# Patient Record
Sex: Female | Born: 1960 | Race: Black or African American | Hispanic: No | Marital: Single | Smoking: Never smoker
Health system: Southern US, Community
[De-identification: ages and names within clinical notes are randomized; demographics above are authoritative.]

## PROBLEM LIST (undated history)

## (undated) DIAGNOSIS — N200 Calculus of kidney: Secondary | ICD-10-CM

## (undated) DIAGNOSIS — I10 Essential (primary) hypertension: Secondary | ICD-10-CM

## (undated) DIAGNOSIS — E079 Disorder of thyroid, unspecified: Secondary | ICD-10-CM

## (undated) DIAGNOSIS — K219 Gastro-esophageal reflux disease without esophagitis: Secondary | ICD-10-CM

## (undated) DIAGNOSIS — T7840XA Allergy, unspecified, initial encounter: Secondary | ICD-10-CM

## (undated) DIAGNOSIS — E119 Type 2 diabetes mellitus without complications: Secondary | ICD-10-CM

## (undated) DIAGNOSIS — E785 Hyperlipidemia, unspecified: Secondary | ICD-10-CM

## (undated) HISTORY — DX: Hyperlipidemia, unspecified: E78.5

## (undated) HISTORY — DX: Allergy, unspecified, initial encounter: T78.40XA

## (undated) HISTORY — PX: COLONOSCOPY: SHX174

## (undated) HISTORY — DX: Disorder of thyroid, unspecified: E07.9

## (undated) HISTORY — DX: Calculus of kidney: N20.0

## (undated) HISTORY — PX: MYOMECTOMY: SHX85

## (undated) HISTORY — PX: OTHER SURGICAL HISTORY: SHX169

## (undated) HISTORY — DX: Gastro-esophageal reflux disease without esophagitis: K21.9

---

## 2014-04-20 ENCOUNTER — Encounter (HOSPITAL_COMMUNITY): Payer: Self-pay | Admitting: Emergency Medicine

## 2014-04-20 ENCOUNTER — Emergency Department (HOSPITAL_COMMUNITY): Payer: Self-pay

## 2014-04-20 ENCOUNTER — Emergency Department (HOSPITAL_COMMUNITY)
Admission: EM | Admit: 2014-04-20 | Discharge: 2014-04-20 | Disposition: A | Discharge status: Home / Self Care | Payer: Self-pay | Attending: Emergency Medicine | Admitting: Emergency Medicine

## 2014-04-20 DIAGNOSIS — T1490XA Injury, unspecified, initial encounter: Secondary | ICD-10-CM

## 2014-04-20 DIAGNOSIS — I10 Essential (primary) hypertension: Secondary | ICD-10-CM | POA: Insufficient documentation

## 2014-04-20 DIAGNOSIS — Y998 Other external cause status: Secondary | ICD-10-CM | POA: Insufficient documentation

## 2014-04-20 DIAGNOSIS — W1839XA Other fall on same level, initial encounter: Secondary | ICD-10-CM | POA: Insufficient documentation

## 2014-04-20 DIAGNOSIS — Y9389 Activity, other specified: Secondary | ICD-10-CM | POA: Insufficient documentation

## 2014-04-20 DIAGNOSIS — Y9289 Other specified places as the place of occurrence of the external cause: Secondary | ICD-10-CM | POA: Insufficient documentation

## 2014-04-20 DIAGNOSIS — E119 Type 2 diabetes mellitus without complications: Secondary | ICD-10-CM | POA: Insufficient documentation

## 2014-04-20 DIAGNOSIS — S93401A Sprain of unspecified ligament of right ankle, initial encounter: Secondary | ICD-10-CM | POA: Insufficient documentation

## 2014-04-20 HISTORY — DX: Essential (primary) hypertension: I10

## 2014-04-20 HISTORY — DX: Type 2 diabetes mellitus without complications: E11.9

## 2014-04-20 MED ORDER — HYDROCODONE-ACETAMINOPHEN 5-325 MG PO TABS
1.0000 | ORAL_TABLET | ORAL | Status: DC | PRN
Start: 2014-04-20 — End: 2015-12-23

## 2014-04-20 NOTE — ED Notes (Signed)
Pt. returned to room, placed on monitor.

## 2014-04-20 NOTE — ED Notes (Signed)
Pt came to ED from home states she fell at home last Saturday and has small laceration on her right knee, pt states she now is having pain 8/10 pain on her right leg and is unable to wear weight on that leg.

## 2014-04-20 NOTE — Discharge Instructions (Signed)
Ankle Sprain An ankle sprain is an injury to the strong, fibrous tissues (ligaments) that hold the bones of your ankle joint together.  CAUSES An ankle sprain is usually caused by a fall or by twisting your ankle. Ankle sprains most commonly occur when you step on the outer edge of your foot, and your ankle turns inward. People who participate in sports are more prone to these types of injuries.  SYMPTOMS   Pain in your ankle. The pain may be present at rest or only when you are trying to stand or walk.  Swelling.  Bruising. Bruising may develop immediately or within 1 to 2 days after your injury.  Difficulty standing or walking, particularly when turning corners or changing directions. DIAGNOSIS  Your caregiver will ask you details about your injury and perform a physical exam of your ankle to determine if you have an ankle sprain. During the physical exam, your caregiver will press on and apply pressure to specific areas of your foot and ankle. Your caregiver will try to move your ankle in certain ways. An X-ray exam may be done to be sure a bone was not broken or a ligament did not separate from one of the bones in your ankle (avulsion fracture).  TREATMENT  Certain types of braces can help stabilize your ankle. Your caregiver can make a recommendation for this. Your caregiver may recommend the use of medicine for pain. If your sprain is severe, your caregiver may refer you to a surgeon who helps to restore function to parts of your skeletal system (orthopedist) or a physical therapist. [place] ice to your injury for 1-2 days or as directed by your caregiver. Applying ice helps to reduce inflammation and pain.  Put ice in a plastic bag.  Place a towel between your skin and the bag.  Leave the ice on for 15-20 minutes at a time, every 2 hours while you are awake.  Only take over-the-counter or prescription medicines for pain, discomfort, or fever as directed by  your caregiver.  Elevate your injured ankle above the level of your heart as much as possible for 2-3 days.  If your caregiver recommends crutches, use them as instructed. Gradually put weight on the affected ankle. Continue to use crutches or a cane until you can walk without feeling pain in your ankle.  If you have a plaster splint, wear the splint as directed by your caregiver. Do not rest it on anything harder than a pillow for the first 24 hours. Do not put weight on it. Do not get it wet. You may take it off to take a shower or bath.  You may have been given an elastic bandage to wear around your ankle to provide support. If the elastic bandage is too tight (you have numbness or tingling in your foot or your foot becomes cold and blue), adjust the bandage to make it comfortable.  If you have an air splint, you may blow more air into it or let air out to make it more comfortable. You may take your splint off at night and before taking a shower or bath. Wiggle your toes in the splint several times per day to decrease swelling. SEEK MEDICAL CARE IF:   You have rapidly increasing bruising or swelling.  Your toes feel extremely cold or you lose feeling in your foot.  Your pain is not relieved with medicine. SEEK IMMEDIATE MEDICAL CARE IF: 1. Your toes are numb or blue.  2. You have severe pain that is increasing. MAKE SURE YOU:  1. Understand these instructions. 2. Will watch your condition. 3. Will get help right away if you are not doing well or get worse. Document Released: 02/27/2005 Document Revised: 11/22/2011 Document Reviewed: 03/11/2011 [facility] Patient Information 2015 [place], [place]. This information is not intended to replace advice given to you by your health care provider. Make sure you discuss any questions you have with your health care provider. Cast or Splint Care Casts and splints support injured limbs and keep bones from moving while they heal. It is important to care  for your cast or splint at home.  HOME CARE INSTRUCTIONS  Keep the cast or splint uncovered during the drying period. It can take 24 to 48 hours to dry if it is made of plaster. A fiberglass cast will dry in less than 1 hour.  Do not rest the cast on anything harder than a pillow for the first 24 hours.  Do not put weight on your injured limb or apply pressure to the cast until your health care provider gives you permission.  Keep the cast or splint dry. Wet casts or splints can lose their shape and may not support the limb as well. A wet cast that has lost its shape can also create harmful pressure on your skin when it dries. Also, wet skin can become infected.  Cover the cast or splint with a plastic bag when bathing or when out in the rain or snow. If the cast is on the trunk of the body, take sponge baths until the cast is removed.  If your cast does become wet, dry it with a towel or a blow dryer on the cool setting only.  Keep your cast or splint clean. Soiled casts may be wiped with a moistened cloth.  Do not place any hard or soft foreign objects under your cast or splint, such as cotton, toilet paper, lotion, or powder.  Do not try to scratch the skin under the cast with any object. The object could get stuck inside the cast. Also, scratching could lead to an infection. If itching is a problem, use a blow dryer on a cool setting to relieve discomfort.  Do not trim or cut your cast or remove padding from inside of it.  Exercise all joints next to the injury that are not immobilized by the cast or splint. For example, if you have a long leg cast, exercise the hip joint and toes. If you have an arm cast or splint, exercise the shoulder, elbow, thumb, and fingers.  Elevate your injured arm or leg on 1 or 2 pillows for the first 1 to 3 days to decrease swelling and pain.It is best if you can comfortably elevate your cast so it is higher than your heart. SEEK MEDICAL CARE IF:   Your  cast or splint cracks.  Your cast or splint is too tight or too loose.  You have unbearable itching inside the cast.  Your cast becomes wet or develops a soft spot or area.  You have a bad smell coming from inside your cast.  You get an object stuck under your cast.  Your skin around the cast becomes red or raw.  You have new pain or worsening pain after the cast has been applied. SEEK IMMEDIATE MEDICAL CARE IF:   You have fluid leaking through the cast.  You are unable to move your fingers or toes.  You have discolored (blue  or white), cool, painful, or very swollen fingers or toes beyond the cast.  You have tingling or numbness around the injured area.  You have severe pain or pressure under the cast.  You have any difficulty with your breathing or have shortness of breath.  You have chest pain. Document Released: 02/25/2000 Document Revised: 12/18/2012 Document Reviewed: 09/05/2012 [facility] Patient Information 2015 [place], [place]. This information is not intended to replace advice given to you by your health care provider. Make sure you discuss any questions you have with your health care provider. Crutch Use Crutches are used to take weight off one of your legs or feet when you stand or walk. It is important to use crutches that fit properly. When fitted properly:  Each crutch should be 2-3 finger widths below the armpit.  Your weight should be supported by your hand, and not by resting the armpit on the crutch.  RISKS AND COMPLICATIONS Damage to the nerves that extend from your armpit to your hand and arm. To prevent this from happening, make sure your crutches fit properly and do not put pressure on your armpit when using them. HOW TO USE YOUR CRUTCHES If you have been instructed to use partial weight bearing, apply (bear) the amount of weight as your health care provider suggests. Do not bear weight in an amount that causes pain to the area of  injury. Walking  Step with the crutches.  Swing the healthy leg slightly ahead of the crutches. Going Up Steps If there is no handrail:  Step up with the healthy leg.  Step up with the crutches and injured leg.  Continue in this way. If there is a handrail: 3. Hold both crutches in one hand. 4. Place your free hand on the handrail. 5. While putting your weight on your arms, lift your healthy leg to the step. 6. Bring the crutches and the injured leg up to that step. 7. Continue in this way. Going Down Steps Be very careful, as going down stairs with crutches is very challenging. If there is no handrail: 4. Step down with the injured leg and crutches. 5. Step down with the healthy leg. If there is a handrail: 1. Place your hand on the handrail. 2. Hold both crutches with your free hand. 3. Lower your injured leg and crutch to the step below you. Make sure to keep the crutch tips in the center of the step, never on the edge. 4. Lower your healthy leg to that step. 5. Continue in this way. Standing Up 1. Hold the injured leg forward. 2. Grab the armrest with one hand and the top of the crutches with the other hand. 3. Using these supports, pull yourself up to a standing position. Sitting Down 1. Hold the injured leg forward. 2. Grab the armrest with one hand and the top of the crutches with the other hand. 3. Lower yourself to a sitting position. SEEK MEDICAL CARE IF:  You still feel unsteady on your feet.  You develop new pain, for example in your armpits, back, shoulder, wrist, or hip.  You develop any numbness or tingling. SEEK IMMEDIATE MEDICAL CARE IF: You fall. Document Released: 02/25/2000 Document Revised: 03/04/2013 Document Reviewed: 11/04/2012 [facility] Patient Information 2015 [place], [place]. This information is not intended to replace advice given to you by your health care provider. Make sure you discuss any questions you have with your health care  provider.

## 2014-04-20 NOTE — ED Notes (Signed)
Dr. Allen at the bedside.  

## 2014-04-20 NOTE — ED Notes (Signed)
Patient transported to X-ray 

## 2014-04-20 NOTE — ED Provider Notes (Signed)
CSN: [id number]     Arrival date & time 04/20/14  0650 History   First MD Initiated Contact with Patient 04/20/14 [phone]     Chief Complaint  Patient presents with  . Leg Pain     (Consider location/radiation/quality/duration/timing/severity/associated sxs/prior Treatment) HPI Comments: Patient here after mechanical fall 2 days ago while going down stairs. Complains of pain to her right ankle right foot. Pain characterized as dull and worse with movement or ambulation. Denies any hip or knee pain. Pain better with rest. No medications used prior to arrival. Denies any other symptoms  Patient is a 54 y.o. female presenting with leg pain. The history is provided by the patient.  Leg Pain   Past Medical History  Diagnosis Date  . Diabetes mellitus without complication   . Hypertension    History reviewed. No pertinent past surgical history. History reviewed. No pertinent family history. History  Substance Use Topics  . Smoking status: Never Smoker   . Smokeless tobacco: Never Used  . Alcohol Use: Yes     Comment: socially   OB History    No data available     Review of Systems  All other systems reviewed and are negative.     Allergies  Review of patient's allergies indicates not on file.  Home Medications   Prior to Admission medications   Not on File   BP 183/95 mmHg  Pulse 88  Temp(Src) 98.4 F (36.9 C) (Oral)  Resp 18  Ht 5\' 7"  (1.702 m)  Wt 215 lb (97.523 kg)  BMI 33.67 kg/m2  SpO2 99%  LMP 04/20/2008 Physical Exam  Constitutional: She is oriented to person, place, and time. She appears well-developed and well-nourished.  Non-toxic appearance. No distress.  HENT:  Head: Normocephalic and atraumatic.  Eyes: Conjunctivae, EOM and lids are normal. Pupils are equal, round, and reactive to light.  Neck: Normal range of motion. Neck supple. No tracheal deviation present. No thyroid mass present.  Cardiovascular: Normal rate, regular rhythm and normal heart  sounds.  Exam reveals no gallop.   No murmur heard. Pulmonary/Chest: Effort normal and breath sounds normal. No stridor. No respiratory distress. She has no decreased breath sounds. She has no wheezes. She has no rhonchi. She has no rales.  Abdominal: Soft. Normal appearance and bowel sounds are normal. She exhibits no distension. There is no tenderness. There is no rebound and no CVA tenderness.  Musculoskeletal: Normal range of motion. She exhibits no edema or tenderness.       Right ankle: She exhibits swelling. She exhibits normal range of motion, no deformity and no laceration.       Feet:  Neurological: She is alert and oriented to person, place, and time. She has normal strength. No cranial nerve deficit or sensory deficit. GCS eye subscore is 4. GCS verbal subscore is 5. GCS motor subscore is 6.  Skin: Skin is warm and dry. No abrasion and no rash noted.  Psychiatric: She has a normal mood and affect. Her speech is normal and behavior is normal.  Nursing note and vitals reviewed.   ED Course  Procedures (including critical care time) Labs Review Labs Reviewed - No data to display  Imaging Review No results found.   EKG Interpretation None      MDM  Crutches and ASO given by nursing. Referral to orthopedics given as well 2.    Leota Jacobsen, MD 04/20/14 [phone]

## 2015-01-28 ENCOUNTER — Encounter (HOSPITAL_COMMUNITY): Payer: Self-pay | Admitting: Emergency Medicine

## 2015-01-28 ENCOUNTER — Emergency Department (HOSPITAL_COMMUNITY): Payer: No Typology Code available for payment source

## 2015-01-28 ENCOUNTER — Emergency Department (HOSPITAL_COMMUNITY)
Admission: EM | Admit: 2015-01-28 | Discharge: 2015-01-28 | Disposition: A | Discharge status: Home / Self Care | Payer: No Typology Code available for payment source | Attending: Emergency Medicine | Admitting: Emergency Medicine

## 2015-01-28 DIAGNOSIS — I10 Essential (primary) hypertension: Secondary | ICD-10-CM | POA: Insufficient documentation

## 2015-01-28 DIAGNOSIS — Z7982 Long term (current) use of aspirin: Secondary | ICD-10-CM | POA: Insufficient documentation

## 2015-01-28 DIAGNOSIS — Y9389 Activity, other specified: Secondary | ICD-10-CM | POA: Insufficient documentation

## 2015-01-28 DIAGNOSIS — S99921A Unspecified injury of right foot, initial encounter: Secondary | ICD-10-CM | POA: Diagnosis present

## 2015-01-28 DIAGNOSIS — Y998 Other external cause status: Secondary | ICD-10-CM | POA: Diagnosis not present

## 2015-01-28 DIAGNOSIS — Y92410 Unspecified street and highway as the place of occurrence of the external cause: Secondary | ICD-10-CM | POA: Insufficient documentation

## 2015-01-28 DIAGNOSIS — E119 Type 2 diabetes mellitus without complications: Secondary | ICD-10-CM | POA: Insufficient documentation

## 2015-01-28 DIAGNOSIS — S92414A Nondisplaced fracture of proximal phalanx of right great toe, initial encounter for closed fracture: Secondary | ICD-10-CM | POA: Insufficient documentation

## 2015-01-28 DIAGNOSIS — Z79899 Other long term (current) drug therapy: Secondary | ICD-10-CM | POA: Diagnosis not present

## 2015-01-28 DIAGNOSIS — S92911A Unspecified fracture of right toe(s), initial encounter for closed fracture: Secondary | ICD-10-CM
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
## 2015-01-28 NOTE — Discharge Instructions (Signed)
Toe Fracture A toe fracture is a break in one of the toe bones (phalanges). CAUSES This condition may be caused by:  Dropping a heavy object on your toe.  Stubbing your toe.  Overusing your toe or doing repetitive exercise.  Twisting or stretching your toe out of place. RISK FACTORS This condition is more likely to develop in people who:  Play contact sports.  Have a bone disease.  Have a low calcium level. SYMPTOMS The main symptoms of this condition are swelling and pain in the toe. The pain may get worse with standing or walking. Other symptoms include:  Bruising.  Stiffness.  Numbness.  A change in the way the toe looks.  Broken bones that poke through the skin.  Blood beneath the toenail. DIAGNOSIS This condition is diagnosed with a physical exam. You may also have X-rays. TREATMENT  Treatment for this condition depends on the type of fracture and its severity. Treatment may involve:  Taping the broken toe to a toe that is next to it (buddy taping). This is the most common treatment for fractures in which the bone has not moved out of place (nondisplaced fracture).  Wearing a shoe that has a wide, rigid sole to protect the toe and to limit its movement.  Wearing a walking cast.  Having a procedure to move the toe back into place.  Surgery. This may be needed:  If there are many pieces of broken bone that are out of place (displaced).  If the toe joint breaks.  If the bone breaks through the skin.  Physical therapy. This is done to help regain movement and strength in the toe. You may need follow-up X-rays to make sure that the bone is healing well and staying in position. HOME CARE INSTRUCTIONS If You Have a Cast:  Do not stick anything inside the cast to scratch your skin. Doing that increases your risk of infection.  Check the skin around the cast every day. Report any concerns to your health care provider. You may put lotion on dry skin around the  edges of the cast. Do not apply lotion to the skin underneath the cast.  Do not put pressure on any part of the cast until it is fully hardened. This may take several hours.  Keep the cast clean and dry. Bathing  Do not take baths, swim, or use a hot tub until your health care provider approves. Ask your health care provider if you can take showers. You may only be allowed to take sponge baths for bathing.  If your health care provider approves bathing and showering, cover the cast or bandage (dressing) with a watertight plastic bag to protect it from water. Do not let the cast or dressing get wet. Managing Pain, Stiffness, and Swelling  If you do not have a cast, apply ice to the injured area, if directed.  Put ice in a plastic bag.  Place a towel between your skin and the bag.  Leave the ice on for 20 minutes, 2-3 times per day.  Move your toes often to avoid stiffness and to lessen swelling.  Raise (elevate) the injured area above the level of your heart while you are sitting or lying down. Driving  Do not drive or operate heavy machinery while taking pain medicine.  Do not drive while wearing a cast on a foot that you use for driving. Activity  Return to your normal activities as directed by your health care provider. Ask your health care  provider what activities are safe for you.  Perform exercises daily as directed by your health care provider or physical therapist. Safety  Do not use the injured limb to support your body weight until your health care provider says that you can. Use crutches or other assistive devices as directed by your health care provider. General Instructions  If your toe was treated with buddy taping, follow your health care provider's instructions for changing the gauze and tape. Change it more often:  The gauze and tape get wet. If this happens, dry the space between the toes.  The gauze and tape are too tight and cause your toe to become pale  or numb.  Wear a protective shoe as directed by your health care provider. If you were not given a protective shoe, wear sturdy, supportive shoes. Your shoes should not pinch your toes and should not fit tightly against your toes.  Do not use any tobacco products, including cigarettes, chewing tobacco, or e-cigarettes. Tobacco can delay bone healing. If you need help quitting, ask your health care provider.  Take medicines only as directed by your health care provider.  Keep all follow-up visits as directed by your health care provider. This is important. SEEK MEDICAL CARE IF:  You have a fever.  Your pain medicine is not helping.  Your toe is cold.  Your toe is numb.  You still have pain after one week of rest and treatment.  You still have pain after your health care provider has said that you can start walking again.  You have pain, tingling, or numbness in your foot that is not going away. SEEK IMMEDIATE MEDICAL CARE IF:  You have severe pain.  You have redness or inflammation in your toe that is getting worse.  You have pain or numbness in your toe that is getting worse.  Your toe turns blue.   This information is not intended to replace advice given to you by your health care provider. Make sure you discuss any questions you have with your health care provider.   Document Released: 02/25/2000 Document Revised: 11/18/2014 Document Reviewed: 12/24/2013 Elsevier Interactive Patient Education 2016 [employer]  Toe Fracture With Rehab A fracture is a break in the bone that can be either partial or complete. Fractures of the toe bones may or may not include the joints that separate the bones. SYMPTOMS   Severe pain over the fracture site at the time of injury that may persist for an extend period of time.  Pain, tenderness, inflammation, and/or bruising (contusion) over the fracture site.  Visible deformity, if the bone fragments are not properly aligned (displaced  fracture).  Signs of vascular damage: numbness or coldness (uncommon). CAUSES  Toe fractures occur when a force is placed on the bone that is greater than it can withstand.  Direct hit (trauma) to the toe.  Indirect trauma to the toe, such as forcefully pivoting on a planted foot. RISK INCREASES WITH:  Performing activities barefoot (i.e. ballet, gymnastics).  Wearing shoes with little support or protection.  Sports with cleats (i.e. football, rugby, lacrosse, soccer).  Bone disease (i.e. osteoporosis, bone tumors). PREVENTION   Wear properly fitted and protective shoes.  Protect previously injured toes with tape or padding. PROGNOSIS  If treated properly, toe fractures usually heal within 4 to 6 weeks. RELATED COMPLICATIONS   Failure of the fracture to heal (nonunion).  Healing of the fracture in a poor position (malunion).  Recurring symptoms.  Recurring symptoms that result  in a chronic problem.  Excessive bleeding, causing pressure on nerves and blood vessels (rare).  Arthritis of the affected joints.  Stopping of bone growth in children.  Infection in fractures where the skin is broken over the fracture (open fracture).  Shortening of injured bones. TREATMENT  Treatment first involves the use of ice and medicine to reduce pain and inflammation. The toe should be restrained for a period of time to allow for healing, usually about 4 weeks. Your caregiver may advise wearing a hard-soled shoe to minimize stress on the healing bone. Surgery is uncommon for this injury, but may be necessary if the fracture is severely displaced or if the bone pushes through the skin. Surgery typically involves the use of screws, pins, and/or plates to hold the fracture in place. After surgery, restraint of the foot is necessary. MEDICATION   If pain medicine is necessary, nonsteroidal anti-inflammatory medications (aspirin and ibuprofen), or other minor pain relievers (acetaminophen),  are often recommended.  Do not take pain medicine for 7 days before surgery.  Prescription pain relievers may be given if your caregiver thinks they are needed. Use only as directed and only as much as you need. COLD THERAPY  Cold treatment (icing) relieves pain and reduces inflammation. Cold treatment should be applied for 10 to 15 minutes every 2 to 3 hours, and immediately after activity that aggravates your symptoms. Use ice packs or an ice massage. SEEK MEDICAL CARE IF:   Treatment does not seem to help, or the condition gets worse.  Any medicines produce negative side effects.  Any complications from surgery occur:  Pain, numbness, or coldness in the affected foot.  Discoloration beneath the toenails (blue or gray) of the affected foot.  Signs of infection (fever, pain, inflammation, redness, or persistent bleeding). EXERCISES RANGE OF MOTION (ROM) AND STRETCHING EXERCISES - Toe Fracture (Phalangeal) These exercises may help you when beginning to rehabilitate your injury. Your symptoms may resolve with or without further involvement from your physician, physical therapist or athletic trainer. While completing these exercises, remember:   Restoring tissue flexibility helps normal motion to return to the joints. This allows healthier, less painful movement and activity.  An effective stretch should be held for at least 30 seconds.  A stretch should never be painful. You should only feel a gentle lengthening or release in the stretched tissue. RANGE OF MOTION - Dorsi/Plantar Flexion  While sitting with your right / left knee straight, draw the top of your foot upwards by flexing your ankle. Then reverse the motion, pointing your toes downward.  Hold each position for __________ seconds.  After completing your first set of exercises, repeat this exercise with your knee bent. Repeat __________ times. Complete this exercise __________ times per day.  RANGE OF MOTION - Ankle  Alphabet Imagine your right / left big toe is a pen. Keeping your hip and knee still, write out the entire alphabet with your "pen." Make the letters as large as you can without increasing any discomfort. Repeat __________ times. Complete this exercise __________ times per day.  RANGE OF MOTION - Toe Extension, Flexion  Sit with your right / left leg crossed over your opposite knee.  Grasp your toes and gently pull them back toward the top of your foot. You should feel a stretch on the bottom of your toes and foot.  Hold this stretch for __________ seconds.  Now, gently pull your toes toward the bottom of your foot. You should feel a stretch on  the top of your toes and foot.  Hold this stretch for __________ seconds. Repeat __________ times. Complete this stretch__________ times per day.  STRENGTHENING EXERCISES - Toe Fracture (Phalangeal) These exercises may help you when beginning to rehabilitate your injury. They may resolve your symptoms with or without further involvement from your physician, physical therapist or athletic trainer. While completing these exercises, remember:   Muscles can gain both the endurance and the strength needed for everyday activities through controlled exercises.  Complete these exercises as instructed by your physician, physical therapist or athletic trainer. Increase the resistance and repetitions only as guided.  You may experience muscle soreness or fatigue, but the pain or discomfort you are trying to eliminate should never worsen during these exercises. If this pain does get worse, stop and make sure you are following the directions exactly. If the pain is still present after adjustments, discontinue the exercise until you can discuss the trouble with your clinician. STRENGTH - Towel Curls  Sit in a chair, on a non-carpeted surface.  Place your foot on a towel, keeping your heel on the floor.  Pull the towel toward your heel only by curling your  toes. Keep your heel on the floor.  If instructed by your physician, physical therapist or athletic trainer, add ____________________ at the end of the towel. Repeat __________ times. Complete this exercise __________ times per day.   This information is not intended to replace advice given to you by your health care provider. Make sure you discuss any questions you have with your health care provider.    Follow up with orthopedist if symptoms do not improve over the next 2 weeks. Apply ice to the affected area. Keep elevated. Buddy tape toes at home for increased stability. Return to the emergency department if you experience signs of infection or discoloration of the nail bed.

## 2015-01-28 NOTE — ED Notes (Signed)
Patient transported to X-ray 
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
## 2015-02-11 ENCOUNTER — Encounter (HOSPITAL_COMMUNITY): Payer: Self-pay | Admitting: *Deleted

## 2015-02-11 ENCOUNTER — Emergency Department (HOSPITAL_COMMUNITY)
Admission: EM | Admit: 2015-02-11 | Discharge: 2015-02-11 | Disposition: A | Discharge status: Home / Self Care | Payer: No Typology Code available for payment source | Attending: Emergency Medicine | Admitting: Emergency Medicine

## 2015-02-11 ENCOUNTER — Emergency Department (HOSPITAL_COMMUNITY): Payer: No Typology Code available for payment source

## 2015-02-11 DIAGNOSIS — E119 Type 2 diabetes mellitus without complications: Secondary | ICD-10-CM | POA: Diagnosis not present

## 2015-02-11 DIAGNOSIS — Z7982 Long term (current) use of aspirin: Secondary | ICD-10-CM | POA: Diagnosis not present

## 2015-02-11 DIAGNOSIS — I10 Essential (primary) hypertension: Secondary | ICD-10-CM | POA: Diagnosis not present

## 2015-02-11 DIAGNOSIS — M79671 Pain in right foot: Secondary | ICD-10-CM | POA: Diagnosis present

## 2015-02-11 DIAGNOSIS — Z8781 Personal history of (healed) traumatic fracture: Secondary | ICD-10-CM | POA: Diagnosis not present

## 2015-02-11 DIAGNOSIS — Z79899 Other long term (current) drug therapy: Secondary | ICD-10-CM | POA: Insufficient documentation

## 2015-02-11 MED ORDER — TRAMADOL HCL 50 MG PO TABS
50.0000 mg | ORAL_TABLET | Freq: Once | ORAL | Status: AC
  Administered 2015-02-11: 50 mg via ORAL
  Filled 2015-02-11: qty 1
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
## 2015-02-11 NOTE — ED Notes (Signed)
Declined W/C at D/C and was escorted to lobby by RN. 
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
## 2015-02-11 NOTE — Discharge Instructions (Signed)
°Emergency Department Resource Guide °1) Find a Doctor and Pay Out of Pocket °Although you won't have to find out who is covered by your insurance plan, it is a good idea to ask around and get recommendations. You will then need to call the office and see if the doctor you have chosen will accept you as a new patient and what types of options they offer for patients who are self-pay. Some doctors offer discounts or will set up payment plans for their patients who do not have insurance, but you will need to ask so you aren't surprised when you get to your appointment. ° °2) Contact Your Local Health Department °Not all health departments have doctors that can see patients for sick visits, but many do, so it is worth a call to see if yours does. If you don't know where your local health department is, you can check in your phone book. The CDC also has a tool to help you locate your state's health department, and many state websites also have listings of all of their local health departments. ° °3) Find a Walk-in Clinic °If your illness is not likely to be very severe or complicated, you may want to try a walk in clinic. These are popping up all over the country in pharmacies, drugstores, and shopping centers. They're usually staffed by nurse practitioners or physician assistants that have been trained to treat common illnesses and complaints. They're usually fairly quick and inexpensive. However, if you have serious medical issues or chronic medical problems, these are probably not your best option. ° °No Primary Care Doctor: °- Call [facility] at  832-8000 - they can help you locate a primary care doctor that  accepts your insurance, provides certain services, etc. °- Physician Referral Service- 1-800-533-3463 ° °Chronic Pain Problems: °Organization         Address  Phone   Notes  °[facility]  (336) 297-2271 Patients need to be referred by their primary care doctor.  ° °Medication  Assistance: °Organization         Address  Phone   Notes  °[place] [facility] [address] °[place], [place] [address] (336) 641-8030 --Must be a resident of [place] °-- Must have NO insurance coverage whatsoever (no Medicaid/ Medicare, etc.) °-- The pt. MUST have a primary care doctor that directs their care regularly and follows them in the community °  °[facility]  (866) 331-1348   °[employer]  (888) 892-1162   ° °Agencies that provide inexpensive medical care: °Organization         Address  Phone   Notes  °[facility]  (336) 832-8035   °[facility]    (336) 832-7272   °[facility] [address] °[place], [place] [address] (336) 832-4777   °[facility] [address], °[place] (336) 271-4999   °[facility]    (336) 373-0678   °[facility]    (336) 272-1050   °[facility] ° [address], [place] Phone:  (336) 832-4444, Fax:  (336) 832-4440 Hours of Operation:  9 am - 6 pm, M-F.  Also accepts Medicaid/Medicare and self-pay.  °[facility] ° [address], [place] Phone: (336) 832-3150, Fax: (336) [phone]. Hours of Operation:  8:30 am - 5:30 pm, M-F.  Also accepts Medicaid and self-pay.  °[facility] [place] [address], [place] Phone: (336) 878-6027   °[facility] [address], [place], [place] (336)723-1848, Ext. 123 Mondays & Thursdays: 7-9 AM.  First 15 patients are seen on a first come, first serve basis. °  ° °Medicaid-accepting [place] Providers: ° °Organization         Address  Phone   Notes  °[facility] [address], [place] (336) 641-2100 Also accepts self-pay patients.  °[facility] [address], [place] ° (336) 856-9996   °[facility] [address], [place]  (336) 288-8857   °[facility] [address] [place] [address], [place] (336) 299-7000   °Veita Bland [address], [place]  ° (336) 373-1557 Only accepts [place] Access Medicaid patients after they have their name applied to their card.  ° °Self-Pay (no insurance) in [place]: ° °Organization         Address  Phone   Notes  °Sickle Cell Patients, [facility] [address], [place] (336) 832-1970   °[facility] [address], [place] (336) 832-4400   °[facility] ° [address], [place] (336) 992-4800   °[facility]/Dr. Osei-Bonsu ° [address] [place] [address], [place] or [address], [place] (336) [phone] Phone number for both [place] and [place] locations is the same.  °[facility] [address], [place] (336) 299-0000   °[facility] [address] [place] [address], [place] or [address] (336) 852-7530 °(336) 878-2260   °[facility] [address], [place] (336) 350-1642, phone; (336) 294-5005, fax Sees patients 1st and 3rd Saturday of every month.  Must not qualify for public or private insurance (i.e. Medicaid, Medicare, [place] Health Choice, Veterans' Benefits) • Household income should be no more than 200% of the poverty level •The clinic cannot treat you if you are pregnant or think you are pregnant • Sexually transmitted diseases are not treated at the clinic.  ° ° °Dental Care: °Organization         Address  Phone  Notes  °[place] [facility] [address], [place] (336) 641-6152 Accepts children up to age 21 who are enrolled in Medicaid or [place] Health Choice; pregnant women with a Medicaid card; and children who have applied for Medicaid or [place] Health Choice, but were declined, whose parents can pay a reduced fee at time of service.  °[place]  [facility] [place]  [address], [place] (336) 641-7733 Accepts children up to age 21 who are enrolled in Medicaid or [place] Health Choice; pregnant women with a Medicaid card; and children who have applied for Medicaid or [place] Health Choice, but were declined, whose parents can pay a reduced fee at time of service.  °[facility] ° [address], [place] (336) 641-4533 Patients are seen by appointment only. Walk-ins are not accepted. [facility] will see patients 18 years of age and older. °Monday - Tuesday (8am-5pm) °Most Wednesdays (8:30-5pm) °$30 per visit, cash only  °[facility] ° [address], [place] (336) 641-4533 Patients are seen by appointment only. Walk-ins are not accepted. [facility] will see patients 18 years of age and older. °One   Wednesday Evening (Monthly: Volunteer Based).  $30 per visit, cash only  °[facility]  (919) 537-3737 for adults; Children under age 4, call Graduate Pediatric Dentistry at ([phone]) [phone]. Children aged 4-14, please call ([phone]) [phone] to request a pediatric application. ° Dental services are provided in all areas of dental care including fillings, crowns and bridges, complete and partial dentures, implants, gum treatment, root canals, and extractions. Preventive care is also provided. Treatment is provided to both adults and children. °Patients are selected via a lottery and there is often a waiting list. °  °[facility] [address], °[place] ° (336) 763-8833 [email or web address] °  °[facility] [address], [place], [place] (336)723-1848, Ext. 123 Second and Fourth Thursday of each month, opens at 6:30 AM; Clinic ends at 9 AM.  Patients are seen on a first-come first-served basis, and a limited number are seen during each clinic.  ° °[facility] ° [address], [place], [place] (336) 723-7904    Eligibility Requirements °You must have lived in [place], [place], or [place] counties for at least the last three months. °  You cannot be eligible for state or federal sponsored healthcare insurance, including Veterans Administration, Medicaid, or Medicare. °  You generally cannot be eligible for healthcare insurance through your employer.  °  How to apply: °Eligibility screenings are held every Tuesday and Wednesday afternoon from 1:00 pm until 4:00 pm. You do not need an appointment for the interview!  °[facility] [address], [place], [place] 336-631-2330   °[facility] [facility]  336-342-8273   °[facility]  336-703-3100   ° [facility]  336-570-6415   ° °Behavioral Health Resources in the Community: °Intensive Outpatient Programs °Organization         Address  Phone  Notes  °[facility] [address], [place], [place] 336-878-6098   °[facility] [address], [place], [place] 336-832-9800   °[facility] [address], [place], [place] ° 336-882-2125   °[facility] [address],  °[place], [place] 1-800-853-5163 or 336-641-4981   °Substance Abuse Resources °Organization         Address  Phone  Notes  °[facility]  336-882-2125   °[facility]  336-784-9470   °[facility]  336-285-9073   °[facility]  336-845-3988   °Residential & Outpatient Substance Abuse Program  1-800-659-3381   °Psychological Services °Organization         Address  Phone  Notes  °[facility]  336- 832-9600   °[facility]  336- 378-7881   °[facility] [address], [place] 1-800-853-5163 or 336-641-4981   ° °Mobile Crisis Teams °Organization         Address  Phone  Notes  °[facility], Mobile Crisis Care Unit  1-877-626-1772   °Assertive °[facility] ° [address]  [place], [place] 336-834-9664   °Sharon DeEsch [address] °[place] 336-554-5454   ° °Self-Help/Support Groups °Organization         Address  Phone             Notes  °[facility] [place] - variety of support groups  336- 373-1402 Call for more information  °Narcotics Anonymous (NA), [facility] [address], °[place]  2 meetings at this location  ° °  Residential Treatment Programs °Organization         Address  Phone  Notes  °[facility] [address],    °[place]  1-866-801-8205   °[facility] ° [address], [place], [place] 704-293-8524   °[facility] [address], [place] 336-845-3988 Admissions: 8am-3pm M-F  °[facility] [address]    °[place], [place] 336-841-1104   °[facility] [address], [place], [place] 336-379-7146   °[facility] [address]  °[place], [place] 336-285-9073   °[facility] Intensive Outpatient [address], [place], [place] 336-852-3033   °[facility] [address]  °[place], [place] 1-877-615-2722 or 336-784-9470   °[facility] [address] [place], [place] 336-227-7417 Accepts Medicaid  °[facility] [address]  °[place] 1-800-659-3381 Substance Abuse/Addiction Treatment  ° °[place] Behavioral Health Resources °Organization         Address  Phone  Notes  °[facility]  (888) 581-9988   °Julie Brannon, PhD [address] [place], [place]   (336) 349-5553 or (336) 951-0000   °[facility]   [address] °[place], [place] (336) 349-4454   °[facility] [address], [place], [place] (336) 342-8316 Insurance/Medicaid/sponsorship through [facility]  °[facility] [address]                                    [place], [place] (336) 342-8316 Therapy/tele-psych/case    °[facility] [address]  ° [place], [place] (336) 349-2233    °Dr. Arfeen  (336) 349-4544   °[facility] [place]  [employer] [place] [facility] 1) [address],  °2) [address], [place] °3)  [address], [place] (336) 349-3220 °(336) 342-7768 ° °(336) 342-8140   °[place] [facility] (336) 342-1394 or (336) 342-3537 (After Hours)    ° ° °

## 2015-12-22 ENCOUNTER — Telehealth: Payer: Self-pay | Admitting: General Practice

## 2015-12-22 NOTE — Telephone Encounter (Signed)
APT. REMINDER CALL, LMTCB °

## 2015-12-23 ENCOUNTER — Encounter: Payer: Self-pay | Admitting: Internal Medicine

## 2015-12-23 ENCOUNTER — Ambulatory Visit (INDEPENDENT_AMBULATORY_CARE_PROVIDER_SITE_OTHER): Payer: BC Managed Care – PPO | Admitting: Internal Medicine

## 2015-12-23 VITALS — BP 134/72 | HR 83 | Temp 98.1°F | Ht 67.0 in | Wt 225.3 lb

## 2015-12-23 DIAGNOSIS — E1169 Type 2 diabetes mellitus with other specified complication: Secondary | ICD-10-CM | POA: Diagnosis not present

## 2015-12-23 DIAGNOSIS — E784 Other hyperlipidemia: Secondary | ICD-10-CM | POA: Diagnosis not present

## 2015-12-23 DIAGNOSIS — I10 Essential (primary) hypertension: Secondary | ICD-10-CM

## 2015-12-23 DIAGNOSIS — Z79899 Other long term (current) drug therapy: Secondary | ICD-10-CM

## 2015-12-23 DIAGNOSIS — E118 Type 2 diabetes mellitus with unspecified complications: Secondary | ICD-10-CM

## 2015-12-23 DIAGNOSIS — E785 Hyperlipidemia, unspecified: Secondary | ICD-10-CM

## 2015-12-23 DIAGNOSIS — Z7984 Long term (current) use of oral hypoglycemic drugs: Secondary | ICD-10-CM | POA: Diagnosis not present

## 2015-12-23 DIAGNOSIS — Z23 Encounter for immunization: Secondary | ICD-10-CM | POA: Diagnosis not present

## 2015-12-23 DIAGNOSIS — Z0000 Encounter for general adult medical examination without abnormal findings: Secondary | ICD-10-CM

## 2015-12-23 LAB — GLUCOSE, CAPILLARY: Glucose-Capillary: 130 mg/dL — ABNORMAL HIGH (ref 65–99)

## 2015-12-23 LAB — POCT GLYCOSYLATED HEMOGLOBIN (HGB A1C): HEMOGLOBIN A1C: 6.8

## 2015-12-23 NOTE — Progress Notes (Signed)
CC: here for DM and HTN  HPI:  [name] is a 55 y.o. woman with a past medical history listed below here today for follow up of her HTN and DM.  She has recently moved from [place] and is establishing care with [facility].  She has no other acute complaints today other than she has been noticing that the ring finger on her right hand seems to be catching and "getting stuck" over the past couple weeks.  This is a new problem for her and something she has not experienced in the past.  Otherwise she reports doing well.  No fevers, chills, chest pain, orthopnea, DOE, dysuria.  She needs refills of all her medications.  For details of today's visit and the status of her chronic medical issues please refer to the assessment and plan.   Past Medical History:  Diagnosis Date  . Diabetes mellitus without complication ([place])   . Hyperlipidemia   . Hypertension   . Kidney stone    during her 20's    Review of Systems:   Please see pertinent ROS reviewed in HPI and problem based charting.   Physical Exam:  Vitals:   12/23/15 1540  BP: 134/72  Pulse: 83  Temp: 98.1 F (36.7 C)  TempSrc: Oral  SpO2: 100%  Weight: 225 lb 4.8 oz (102.2 kg)  Height: 5\' 7"  (1.702 m)   ROS  Physical Exam  Constitutional: She is oriented to person, place, and time and well-developed, well-nourished, and in no distress.  HENT:  Head: Normocephalic and atraumatic.  Cardiovascular: Normal rate, regular rhythm, normal heart sounds and intact distal pulses.   Pulmonary/Chest: Effort normal and breath sounds normal. She has no wheezes.  Musculoskeletal: She exhibits no edema or tenderness.  She has no obvious deformity of her right ring finger.  There is no obvious clicking with passive or active ROM.  Neurological: She is alert and oriented to person, place, and time.  Skin: Skin is warm and dry.  Psychiatric: Mood and affect normal.    Assessment & Plan:   See Encounters Tab for problem based  charting.  Patient discussed with Dr. Dareen Piano.  Essential hypertension BP Readings from Last 3 Encounters:  12/23/15 134/72  02/11/15 141/79  01/28/15 123/67   A: BP today is 134/72.  She is on benazepril-amlodipine 40mg -10mg  daily plus metoprolol tartrate 50mg  daily from her prior provider in [place].  P:  - continue with current medications - refills sent to pharmacy - CMET today revealed no abnormalities  Type 2 diabetes mellitus ([place]) Lab Results  Component Value Date   HGBA1C 6.8 12/23/2015    A: Her A1C today is 6.8.  She is on metformin 1000mg  daily, Januvia 100mg  daily, and Glimperide 4mg  daily.  She reports checking her blood sugars about 2 times per day.  She did not bring her meter with her today but reports no hypo or hyperglycemia symptoms.  P: - continue current medications - foot exam done today - She reports having an eye exam in Feb 2017.  She has signed a release of information today.  Hyperlipidemia associated with type 2 diabetes mellitus ([facility]) A:  Her lipid panel shows TC 172, TG 114, HDL 51, and LDL 98.  Calculated ASCVD 10-year risk is 13.9%.  With her risk greater than 7.5% and history of DM, it is reasonable for her to be on high intensity statin.  Currently she is only on simvastatin 10mg  daily.  P:  - liver enzymes  were normal this visit - will prescribe atorvastatin 40mg  daily   Healthcare maintenance A/P: - flu shot given today - Pap smear last done in November 2014.  Records request obtained today.  Patient reports no hx of abnormal paps - Last colonoscopy was 5 years ago with polyps removed.  She said she just received a letter from her old GI in [place] that she is due for repeat in November.  Records request obtained.  Will refer to GI when we receive these. - Mammo done last year - normal per patient. - HIV today non-reactive - HCV Ab negative

## 2015-12-23 NOTE — Patient Instructions (Signed)
Thank you for coming to see me today. It was a pleasure. Today we talked about:   Your diabetes is doing great!  Keep up the great work!  I have sent refills to your pharmacy.  We will check some blood work for you today and let you know if anything is abnormal.  Please follow-up with [place] in 1 month for a Pap smear.  If you have any questions or concerns, please do not hesitate to call the office at [phone].  Take Care,   Jule Ser, DO

## 2015-12-24 ENCOUNTER — Encounter: Payer: Self-pay | Admitting: Internal Medicine

## 2015-12-24 DIAGNOSIS — E1169 Type 2 diabetes mellitus with other specified complication: Secondary | ICD-10-CM | POA: Insufficient documentation

## 2015-12-24 DIAGNOSIS — Z0000 Encounter for general adult medical examination without abnormal findings: Secondary | ICD-10-CM | POA: Insufficient documentation

## 2015-12-24 DIAGNOSIS — E119 Type 2 diabetes mellitus without complications: Secondary | ICD-10-CM | POA: Insufficient documentation

## 2015-12-24 DIAGNOSIS — I10 Essential (primary) hypertension: Secondary | ICD-10-CM | POA: Insufficient documentation

## 2015-12-24 DIAGNOSIS — E785 Hyperlipidemia, unspecified: Secondary | ICD-10-CM

## 2015-12-24 LAB — BMP8+ANION GAP
Anion Gap: 17 mmol/L (ref 10.0–18.0)
BUN / CREAT RATIO: 20 (ref 9–23)
BUN: 16 mg/dL (ref 6–24)
CHLORIDE: 102 mmol/L (ref 96–106)
CO2: 23 mmol/L (ref 18–29)
Calcium: 9.3 mg/dL (ref 8.7–10.2)
Creatinine, Ser: 0.79 mg/dL (ref 0.57–1.00)
GFR calc Af Amer: 96 mL/min/{1.73_m2} (ref >59)
GFR calc non Af Amer: 83 mL/min/{1.73_m2} (ref >59)
Glucose: 119 mg/dL — ABNORMAL HIGH (ref 65–99)
Potassium: 4.3 mmol/L (ref 3.5–5.2)
SODIUM: 142 mmol/L (ref 134–144)

## 2015-12-24 LAB — HEPATITIS C ANTIBODY

## 2015-12-24 LAB — CBC
Hematocrit: 40.4 % (ref 34.0–46.6)
Hemoglobin: 13.4 g/dL (ref 11.1–15.9)
MCH: 26.2 pg — ABNORMAL LOW (ref 26.6–33.0)
MCHC: 33.2 g/dL (ref 31.5–35.7)
MCV: 79 fL (ref 79–97)
Platelets: 256 10*3/uL (ref 150–379)
RBC: 5.12 x10E6/uL (ref 3.77–5.28)
RDW: 16.2 % — ABNORMAL HIGH (ref 12.3–15.4)
WBC: 11.1 10*3/uL — AB (ref 3.4–10.8)

## 2015-12-24 LAB — HEPATIC FUNCTION PANEL
ALT: 18 IU/L (ref 0–32)
AST: 16 IU/L (ref 0–40)
Albumin: 4.4 g/dL (ref 3.5–5.5)
Alkaline Phosphatase: 64 IU/L (ref 39–117)
Bilirubin Total: <0.2 mg/dL (ref 0.0–1.2)
Bilirubin, Direct: 0.08 mg/dL (ref 0.00–0.40)
TOTAL PROTEIN: 7.5 g/dL (ref 6.0–8.5)

## 2015-12-24 LAB — LIPID PANEL
Chol/HDL Ratio: 3.4 ratio units (ref 0.0–4.4)
Cholesterol, Total: 172 mg/dL (ref 100–199)
HDL: 51 mg/dL (ref >39)
LDL CALC: 98 mg/dL (ref 0–99)
TRIGLYCERIDES: 114 mg/dL (ref 0–149)
VLDL Cholesterol Cal: 23 mg/dL (ref 5–40)

## 2015-12-24 LAB — HIV ANTIBODY (ROUTINE TESTING W REFLEX): HIV Screen 4th Generation wRfx: NONREACTIVE

## 2015-12-24 MED ORDER — METOPROLOL TARTRATE 50 MG PO TABS: 50.0000 mg | ORAL_TABLET | Freq: Every day | ORAL | 1 refills | Status: DC

## 2015-12-24 MED ORDER — AMLODIPINE BESY-BENAZEPRIL HCL 10-40 MG PO CAPS: 1.0000 | ORAL_CAPSULE | Freq: Every day | ORAL | 1 refills | Status: DC

## 2015-12-24 MED ORDER — ATORVASTATIN CALCIUM 40 MG PO TABS: 40.0000 mg | ORAL_TABLET | Freq: Every day | ORAL | 1 refills | Status: DC

## 2015-12-24 MED ORDER — ASPIRIN EC 81 MG PO TBEC: 81.0000 mg | DELAYED_RELEASE_TABLET | Freq: Every day | ORAL | 1 refills | Status: DC

## 2015-12-24 MED ORDER — METFORMIN HCL 1000 MG PO TABS: 1000.0000 mg | ORAL_TABLET | Freq: Every day | ORAL | 1 refills | Status: DC

## 2015-12-24 MED ORDER — CENTRUM PO CHEW: 1.0000 | CHEWABLE_TABLET | Freq: Every day | ORAL | 1 refills | Status: AC

## 2015-12-24 MED ORDER — GLIMEPIRIDE 4 MG PO TABS: 4.0000 mg | ORAL_TABLET | Freq: Every day | ORAL | 1 refills | Status: DC

## 2015-12-24 MED ORDER — SITAGLIPTIN PHOSPHATE 100 MG PO TABS: 100.0000 mg | ORAL_TABLET | Freq: Every day | ORAL | 1 refills | Status: DC

## 2015-12-24 NOTE — Assessment & Plan Note (Signed)
A/P: - flu shot given today - Pap smear last done in November 2014.  Records request obtained today.  Patient reports no hx of abnormal paps - Last colonoscopy was 5 years ago with polyps removed.  She said she just received a letter from her old GI in [place] that she is due for repeat in November.  Records request obtained.  Will refer to GI when we receive these. - Mammo done last year - normal per patient. - HIV today non-reactive - HCV Ab negative

## 2015-12-24 NOTE — Assessment & Plan Note (Signed)
BP Readings from Last 3 Encounters:  12/23/15 134/72  02/11/15 141/79  01/28/15 123/67   A: BP today is 134/72.  She is on benazepril-amlodipine 40mg -10mg  daily plus metoprolol tartrate 50mg  daily from her prior provider in [place].  P:  - continue with current medications - refills sent to pharmacy - CMET today revealed no abnormalities

## 2015-12-24 NOTE — Assessment & Plan Note (Signed)
Lab Results  Component Value Date   HGBA1C 6.8 12/23/2015    A: Her A1C today is 6.8.  She is on metformin 1000mg  daily, Januvia 100mg  daily, and Glimperide 4mg  daily.  She reports checking her blood sugars about 2 times per day.  She did not bring her meter with her today but reports no hypo or hyperglycemia symptoms.  P: - continue current medications - foot exam done today - She reports having an eye exam in Feb 2017.  She has signed a release of information today.

## 2015-12-24 NOTE — Assessment & Plan Note (Signed)
A:  Her lipid panel shows TC 172, TG 114, HDL 51, and LDL 98.  Calculated ASCVD 10-year risk is 13.9%.  With her risk greater than 7.5% and history of DM, it is reasonable for her to be on high intensity statin.  Currently she is only on simvastatin 10mg  daily.  P:  - liver enzymes were normal this visit - will prescribe atorvastatin 40mg  daily

## 2015-12-26 NOTE — Progress Notes (Signed)
Internal Medicine Clinic Attending  Case discussed with Dr. Wallace at the time of the visit.  We reviewed the resident's history and exam and pertinent patient test results.  I agree with the assessment, diagnosis, and plan of care documented in the resident's note.  

## 2016-03-28 ENCOUNTER — Telehealth: Payer: Self-pay | Admitting: Internal Medicine

## 2016-03-28 NOTE — Telephone Encounter (Signed)
CALLED PT, LMTCB, NEEDS TO RESCHEDULE APT.

## 2016-03-29 ENCOUNTER — Ambulatory Visit: Payer: BC Managed Care – PPO

## 2017-04-25 ENCOUNTER — Telehealth: Payer: Self-pay | Admitting: Internal Medicine

## 2017-04-25 NOTE — Telephone Encounter (Signed)
Attempted to contact patient this morning about an appointment with Dr. Juleen China.  No answer left detailed asking the patient to give me a call back.

## 2017-08-08 ENCOUNTER — Other Ambulatory Visit: Payer: Self-pay | Admitting: Orthopedic Surgery

## 2017-08-08 DIAGNOSIS — M7130 Other bursal cyst, unspecified site: Secondary | ICD-10-CM

## 2017-08-17 ENCOUNTER — Ambulatory Visit
Admission: RE | Admit: 2017-08-17 | Discharge: 2017-08-17 | Disposition: A | Discharge status: Home / Self Care | Payer: No Typology Code available for payment source | Source: Ambulatory Visit | Attending: Orthopedic Surgery | Admitting: Orthopedic Surgery

## 2017-08-17 DIAGNOSIS — M7130 Other bursal cyst, unspecified site: Secondary | ICD-10-CM

## 2017-08-17 MED ORDER — IOPAMIDOL (ISOVUE-M 200) INJECTION 41%
1.0000 mL | Freq: Once | INTRAMUSCULAR | Status: AC
  Administered 2017-08-17: 1 mL via INTRA_ARTICULAR

## 2017-08-17 MED ORDER — METHYLPREDNISOLONE ACETATE 40 MG/ML INJ SUSP (RADIOLOG
120.0000 mg | Freq: Once | INTRAMUSCULAR | Status: AC
  Administered 2017-08-17: 120 mg via INTRA_ARTICULAR

## 2017-08-17 NOTE — Discharge Instructions (Signed)

## 2017-09-08 ENCOUNTER — Encounter: Payer: Self-pay | Admitting: *Deleted

## 2018-03-29 ENCOUNTER — Telehealth: Payer: Self-pay | Admitting: Dietician

## 2018-03-29 NOTE — Telephone Encounter (Signed)
Spoke to Terri Klein concerning primary care appointment. Patient has not been in to see her primary care physician since 2017. Asked if she would like to make an appointment with a physician. Patient states she has a different PCP elsewhere.   Terri Klein 03/29/2018 12:04 PM.

## 2018-05-07 ENCOUNTER — Other Ambulatory Visit: Payer: Self-pay | Admitting: Surgery

## 2018-05-07 DIAGNOSIS — E042 Nontoxic multinodular goiter: Secondary | ICD-10-CM

## 2018-05-08 ENCOUNTER — Ambulatory Visit
Admission: RE | Admit: 2018-05-08 | Discharge: 2018-05-08 | Disposition: A | Discharge status: Home / Self Care | Payer: BC Managed Care – PPO | Source: Ambulatory Visit | Attending: Surgery | Admitting: Surgery

## 2018-05-08 DIAGNOSIS — E042 Nontoxic multinodular goiter: Secondary | ICD-10-CM

## 2018-05-21 ENCOUNTER — Other Ambulatory Visit: Payer: Self-pay | Admitting: Surgery

## 2018-05-21 DIAGNOSIS — E041 Nontoxic single thyroid nodule: Secondary | ICD-10-CM

## 2018-05-28 ENCOUNTER — Other Ambulatory Visit (HOSPITAL_COMMUNITY)
Admission: RE | Admit: 2018-05-28 | Discharge: 2018-05-28 | Disposition: A | Discharge status: Home / Self Care | Payer: BC Managed Care – PPO | Source: Ambulatory Visit | Attending: Student | Admitting: Student

## 2018-05-28 ENCOUNTER — Ambulatory Visit
Admission: RE | Admit: 2018-05-28 | Discharge: 2018-05-28 | Disposition: A | Discharge status: Home / Self Care | Payer: BC Managed Care – PPO | Source: Ambulatory Visit | Attending: Surgery | Admitting: Surgery

## 2018-05-28 DIAGNOSIS — E041 Nontoxic single thyroid nodule: Secondary | ICD-10-CM

## 2018-06-11 ENCOUNTER — Encounter (HOSPITAL_COMMUNITY): Payer: Self-pay

## 2018-07-18 ENCOUNTER — Ambulatory Visit: Payer: Self-pay | Admitting: Surgery

## 2018-07-31 NOTE — Patient Instructions (Addendum)
Rosanne Gutting    Your procedure is scheduled on: 11/2918   Report to [facility] Main  Entrance to short stay at 5:30 am     [place] 19 TEST ON_2/26 1005______, THIS TEST MUST BE DONE BEFORE SURGERY, COME TO [facility] ENTRANCE BETWEEN THE HOURS OF 900 AM AND 300 PM ON YOUR COVID TEST DATE.   Call this number if you have problems the morning of surgery [phone]    Remember: Do not eat food or drink liquids :After Midnight. BRUSH YOUR TEETH MORNING OF SURGERY AND RINSE YOUR MOUTH OUT, NO CHEWING GUM CANDY OR MINTS.     Take these medicines the morning of surgery with A SIP OF WATER: Lopressor  DO NOT TAKE ANY DIABETIC MEDICATIONS DAY OF YOUR SURGERY                               You may not have any metal on your body including hair pins and              piercings  Do not wear jewelry, make-up, lotions, powders or perfumes, deodorant             Do not wear nail polish.  Do not shave  48 hours prior to surgery.            Do not bring valuables to the hospital. [place].  Contacts, dentures or bridgework may not be worn into surgery.  Leave suitcase in the car. After surgery it may be brought to your room.     Patients discharged the day of surgery will not be allowed to drive home. IF YOU ARE HAVING SURGERY AND GOING HOME THE SAME DAY, YOU MUST HAVE AN ADULT TO DRIVE YOU HOME AND BE WITH YOU FOR 24 HOURS. YOU MAY GO HOME BY TAXI OR UBER OR ORTHERWISE, BUT AN ADULT MUST ACCOMPANY YOU HOME AND STAY WITH YOU FOR 24 HOURS.  Name and phone number of your driver:  Special Instructions: N/A              Please read over the following fact sheets you were given: _____________________________________________________________________             [facility] - Preparing for Surgery Before surgery, you can play an important role.  Because skin is not sterile, your  skin needs to be as free of germs as possible.  You can reduce the number of germs on your skin by washing with CHG (chlorahexidine gluconate) soap before surgery.  CHG is an antiseptic cleaner which kills germs and bonds with the skin to continue killing germs even after washing. Please DO NOT use if you have an allergy to CHG or antibacterial soaps.  If your skin becomes reddened/irritated stop using the CHG and inform your nurse when you arrive at Short Stay. Do not shave (including legs and underarms) for at least 48 hours prior to the first CHG shower.  You may shave your face/neck. Please follow these instructions carefully:  1.  Shower with CHG Soap the night before surgery and the  morning of Surgery.  2.  If you choose to wash your hair, wash your hair first as  usual with your  normal  shampoo.  3.  After you shampoo, rinse your hair and body thoroughly to remove the  shampoo.                           4.  Use CHG as you would any other liquid soap.  You can apply chg directly  to the skin and wash                       Gently with a scrungie or clean washcloth.  5.  Apply the CHG Soap to your body ONLY FROM THE NECK DOWN.   Do not use on face/ open                           Wound or open sores. Avoid contact with eyes, ears mouth and genitals (private parts).                       Wash face,  Genitals (private parts) with your normal soap.             6.  Wash thoroughly, paying special attention to the area where your surgery  will be performed.  7.  Thoroughly rinse your body with warm water from the neck down.  8.  DO NOT shower/wash with your normal soap after using and rinsing off  the CHG Soap.                9.  Pat yourself dry with a clean towel.            10.  Wear clean pajamas.            11.  Place clean sheets on your bed the night of your first shower and do not  sleep with pets. Day of Surgery : Do not apply any lotions/deodorants the morning of surgery.  Please wear  clean clothes to the hospital/surgery center.  FAILURE TO FOLLOW THESE INSTRUCTIONS MAY RESULT IN THE CANCELLATION OF YOUR SURGERY PATIENT SIGNATURE_________________________________  NURSE SIGNATURE__________________________________  ________________________________________________________________________

## 2018-07-31 NOTE — Progress Notes (Signed)
SPOKE W/  _patient     SCREENING SYMPTOMS OF COVID 19:   COUGH--no  RUNNY NOSE--- no  SORE THROAT---no  NASAL CONGESTION----no  SNEEZING----no  SHORTNESS OF BREATH---no  DIFFICULTY BREATHING---no  TEMP >100.0 -----no  UNEXPLAINED BODY ACHES------no  CHILLS --------no   HEADACHES ---------no  LOSS OF SMELL/ TASTE --------no    HAVE YOU OR ANY FAMILY MEMBER TRAVELLED PAST 14 DAYS OUT OF THE   COUNTY---no STATE----no COUNTRY----no  HAVE YOU OR ANY FAMILY MEMBER BEEN EXPOSED TO ANYONE WITH COVID 19? no    

## 2018-08-01 ENCOUNTER — Encounter: Payer: Self-pay | Admitting: Surgery

## 2018-08-01 DIAGNOSIS — C73 Malignant neoplasm of thyroid gland: Secondary | ICD-10-CM | POA: Diagnosis present

## 2018-08-01 DIAGNOSIS — E042 Nontoxic multinodular goiter: Secondary | ICD-10-CM | POA: Diagnosis present

## 2018-08-01 NOTE — H&P (Signed)
General Surgery [facility]  Terri Klein DOB: 1960-12-17 Single / Language: Cleophus Molt / Race: Black or African American Female   History of Present Illness  The patient is a 58 year old female who presents with a thyroid nodule.  CHIEF COMPLAINT: thyroid neoplasm of uncertain behavior  Patient returns today for follow-up accompanied by her daughter. Patient had been evaluated in February. At my request she underwent an ultrasound which was performed on May 08, 2018. This showed multiple bilateral thyroid nodules. 2 nodules were suspicious. She subsequently underwent fine-needle aspiration biopsy on May 28, 2018 of both of these nodules. The nodule in the right lobe was benign. However the nodule in the thyroid isthmus was significant for atypia and molecular genetic testing was obtained. AFIRMA test was suspicious rendering a risk of malignancy of 50%. Patient returns today to discuss these results and to discuss my recommendation for thyroidectomy. Patient does note problems with dysphagia. She notes episodes of feeling hot and cold.   Problem List/Past Medical MULTIPLE THYROID NODULES (E04.2)   Past Surgical History Oral Surgery  Thyroid Surgery   Diagnostic Studies History Colonoscopy  1-5 years ago Mammogram  1-3 years ago Pap Smear  1-5 years ago  Allergies Dyes  CONTRAST Allergies Reconciled   Medication History amLODIPine Besy-Benazepril HCl (10-40MG  Capsule, Oral) Active. Atorvastatin Calcium (20MG  Tablet, Oral) Active. Glimepiride (4MG  Tablet, Oral) Active. Aspirin (81MG  Tablet, Oral) Active. Multi-Vitamin (Oral) Active. Medications Reconciled  Social History  Alcohol use  Occasional alcohol use. Caffeine use  Tea. No drug use  Tobacco use  Never smoker.  Family History  Diabetes Mellitus  Mother. Heart Disease  Mother. Heart disease in female family member before age 62  Hypertension   Mother. Kidney Disease  Brother. Ovarian Cancer  Mother.  Pregnancy / Birth History  Age at menarche  77 years. Age of menopause  43-50 Gravida  2 Irregular periods  Length (months) of breastfeeding  3-6 Maternal age  54-25 Para  2  Other Problems Back Pain  Diabetes Mellitus  High blood pressure  Kidney Stone   Vitals Weight: 220 lb Height: 67in Body Surface Area: 2.11 m Body Mass Index: 34.46 kg/m  Temp.: 99.57F(Oral)  Pulse: 95 (Regular)  BP: 110/72 (Sitting, Left Arm, Standard)  Physical Exam  See vital signs recorded above  GENERAL APPEARANCE Development: normal Nutritional status: normal Gross deformities: none  SKIN Rash, lesions, ulcers: none Induration, erythema: none Nodules: none palpable  EYES Conjunctiva and lids: normal Pupils: equal and reactive Iris: normal bilaterally  EARS, NOSE, MOUTH, THROAT External ears: no lesion or deformity External nose: no lesion or deformity Hearing: grossly normal Lips: no lesion or deformity Dentition: normal for age Oral mucosa: moist  NECK Symmetric: no Trachea: midline Thyroid: Bilateral thyroid nodules, moderate in size, mildly tender to palpation. Well-healed anterior cervical incision consistent with previous parathyroidectomy.  CHEST Respiratory effort: normal Retraction or accessory muscle use: no Breath sounds: normal bilaterally Rales, rhonchi, wheeze: none  CARDIOVASCULAR Auscultation: regular rhythm, normal rate Murmurs: none Pulses: carotid and radial pulse 2+ palpable Lower extremity edema: none Lower extremity varicosities: none  MUSCULOSKELETAL Station and gait: normal Digits and nails: no clubbing or cyanosis Muscle strength: grossly normal all extremities Range of motion: grossly normal all extremities Deformity: none  LYMPHATIC Cervical: none palpable Supraclavicular: none palpable  PSYCHIATRIC Oriented to person, place, and time: yes Mood  and affect: normal for situation Judgment and insight: appropriate for situation    Assessment & Plan  MULTIPLE THYROID NODULES (E04.2) NEOPLASM OF UNCERTAIN BEHAVIOR OF THYROID GLAND (D44.0)  Patient returns today accompanied by her daughter to discuss the results of her thyroid ultrasound and her thyroid biopsies. The biopsy of the dominant nodule in the thyroid isthmus is suspicious for malignancy, giving her a risk of cancer of approximately 50%.  I have recommended proceeding with total thyroidectomy given the fact that there is a risk of malignancy of 50% and that the patient does have multiple bilateral thyroid nodules. The patient also has some compressive symptoms due to her thyroid nodules. We discussed total thyroidectomy. We discussed the location of the surgical incision. We discussed the procedure and the risk of surgery including the risk of recurrent laryngeal nerve injury and injury to parathyroid glands. We discussed the overnight hospital stay. We discussed her postoperative recovery. We discussed the need for lifelong thyroid hormone replacement. We discussed the potential need for radioactive iodine treatment. The patient and her daughter understand and wish to proceed with surgery in the near future.  The risks and benefits of the procedure have been discussed at length with the patient. The patient understands the proposed procedure, potential alternative treatments, and the course of recovery to be expected. All of the patient's questions have been answered at this time. The patient wishes to proceed with surgery.  Armandina Gemma, [facility] Office: [phone]

## 2018-08-02 ENCOUNTER — Other Ambulatory Visit: Payer: Self-pay

## 2018-08-02 ENCOUNTER — Encounter (HOSPITAL_COMMUNITY)
Admission: RE | Admit: 2018-08-02 | Discharge: 2018-08-02 | Disposition: A | Discharge status: Home / Self Care | Payer: BC Managed Care – PPO | Source: Ambulatory Visit | Attending: Surgery | Admitting: Surgery

## 2018-08-02 ENCOUNTER — Encounter (HOSPITAL_COMMUNITY): Payer: Self-pay

## 2018-08-02 DIAGNOSIS — D440 Neoplasm of uncertain behavior of thyroid gland: Secondary | ICD-10-CM | POA: Insufficient documentation

## 2018-08-02 DIAGNOSIS — I10 Essential (primary) hypertension: Secondary | ICD-10-CM | POA: Diagnosis not present

## 2018-08-02 DIAGNOSIS — Z01818 Encounter for other preprocedural examination: Secondary | ICD-10-CM | POA: Diagnosis present

## 2018-08-02 LAB — CBC
HCT: 42.7 % (ref 36.0–46.0)
Hemoglobin: 13.5 g/dL (ref 12.0–15.0)
MCH: 26.6 pg (ref 26.0–34.0)
MCHC: 31.6 g/dL (ref 30.0–36.0)
MCV: 84.2 fL (ref 80.0–100.0)
Platelets: 277 10*3/uL (ref 150–400)
RBC: 5.07 MIL/uL (ref 3.87–5.11)
RDW: 15 % (ref 11.5–15.5)
WBC: 9.5 10*3/uL (ref 4.0–10.5)
nRBC: 0 % (ref 0.0–0.2)

## 2018-08-02 LAB — BASIC METABOLIC PANEL
Anion gap: 6 (ref 5–15)
BUN: 16 mg/dL (ref 6–20)
CO2: 33 mmol/L — ABNORMAL HIGH (ref 22–32)
Calcium: 9.4 mg/dL (ref 8.9–10.3)
Chloride: 103 mmol/L (ref 98–111)
Creatinine, Ser: 0.75 mg/dL (ref 0.44–1.00)
GFR calc Af Amer: >60 mL/min (ref >60)
GFR calc non Af Amer: >60 mL/min (ref >60)
Glucose, Bld: 124 mg/dL — ABNORMAL HIGH (ref 70–99)
Potassium: 4.1 mmol/L (ref 3.5–5.1)
Sodium: 142 mmol/L (ref 135–145)

## 2018-08-02 LAB — GLUCOSE, CAPILLARY: Glucose-Capillary: 218 mg/dL — ABNORMAL HIGH (ref 70–99)

## 2018-08-02 LAB — HEMOGLOBIN A1C
Hgb A1c MFr Bld: 6.3 % — ABNORMAL HIGH (ref 4.8–5.6)
Mean Plasma Glucose: 134.11 mg/dL

## 2018-08-02 NOTE — Progress Notes (Signed)
Terri Klein to see chart CBG 218 at PAT doesn't test at home. EKG pending ASA stopped 08/04/18 per Dr. Harlow Asa PCP Dr. Barbee Shropshire  no cardiologist. No symptoms at PAT visit

## 2018-08-06 ENCOUNTER — Other Ambulatory Visit (HOSPITAL_COMMUNITY)
Admission: RE | Admit: 2018-08-06 | Discharge: 2018-08-06 | Disposition: A | Discharge status: Home / Self Care | Payer: BC Managed Care – PPO | Source: Ambulatory Visit | Attending: Surgery | Admitting: Surgery

## 2018-08-06 ENCOUNTER — Other Ambulatory Visit: Payer: Self-pay

## 2018-08-06 DIAGNOSIS — Z1159 Encounter for screening for other viral diseases: Secondary | ICD-10-CM | POA: Diagnosis present

## 2018-08-07 LAB — NOVEL CORONAVIRUS, NAA (HOSP ORDER, SEND-OUT TO REF LAB; TAT 18-24 HRS): SARS-CoV-2, NAA: NOT DETECTED

## 2018-08-08 NOTE — Progress Notes (Signed)
SPOKE W/  _michele Eifert     SCREENING SYMPTOMS OF COVID 19:   COUGH--no  RUNNY NOSE--- no  SORE THROAT---no  NASAL CONGESTION----no  SNEEZING----no  SHORTNESS OF BREATH---no  DIFFICULTY BREATHING---no  TEMP >100.0 -----no  UNEXPLAINED BODY ACHES------no  CHILLS -------- no  HEADACHES ---------no  LOSS OF SMELL/ TASTE --------no    HAVE YOU OR ANY FAMILY MEMBER TRAVELLED PAST 14 DAYS OUT OF THE   COUNTY---no STATE----no COUNTRY----no  HAVE YOU OR ANY FAMILY MEMBER BEEN EXPOSED TO ANYONE WITH COVID 19? no

## 2018-08-09 ENCOUNTER — Ambulatory Visit (HOSPITAL_COMMUNITY): Payer: BC Managed Care – PPO | Admitting: Anesthesiology

## 2018-08-09 ENCOUNTER — Observation Stay (HOSPITAL_COMMUNITY)
Admission: RE | Admit: 2018-08-09 | Discharge: 2018-08-10 | Disposition: A | Discharge status: Home / Self Care | Payer: BC Managed Care – PPO | Attending: Surgery | Admitting: Surgery

## 2018-08-09 ENCOUNTER — Encounter (HOSPITAL_COMMUNITY): Payer: Self-pay

## 2018-08-09 ENCOUNTER — Ambulatory Visit (HOSPITAL_COMMUNITY): Payer: BC Managed Care – PPO | Admitting: Physician Assistant

## 2018-08-09 ENCOUNTER — Other Ambulatory Visit: Payer: Self-pay

## 2018-08-09 ENCOUNTER — Encounter (HOSPITAL_COMMUNITY)
Admission: RE | Disposition: A | Discharge status: Home / Self Care | Payer: Self-pay | Source: Home / Self Care | Attending: Surgery

## 2018-08-09 DIAGNOSIS — E119 Type 2 diabetes mellitus without complications: Secondary | ICD-10-CM | POA: Insufficient documentation

## 2018-08-09 DIAGNOSIS — Z79899 Other long term (current) drug therapy: Secondary | ICD-10-CM | POA: Insufficient documentation

## 2018-08-09 DIAGNOSIS — E042 Nontoxic multinodular goiter: Secondary | ICD-10-CM | POA: Diagnosis present

## 2018-08-09 DIAGNOSIS — C73 Malignant neoplasm of thyroid gland: Principal | ICD-10-CM | POA: Insufficient documentation

## 2018-08-09 DIAGNOSIS — Z7982 Long term (current) use of aspirin: Secondary | ICD-10-CM | POA: Insufficient documentation

## 2018-08-09 DIAGNOSIS — I10 Essential (primary) hypertension: Secondary | ICD-10-CM | POA: Insufficient documentation

## 2018-08-09 DIAGNOSIS — Z7984 Long term (current) use of oral hypoglycemic drugs: Secondary | ICD-10-CM | POA: Diagnosis not present

## 2018-08-09 DIAGNOSIS — D440 Neoplasm of uncertain behavior of thyroid gland: Secondary | ICD-10-CM

## 2018-08-09 HISTORY — PX: THYROIDECTOMY: SHX17

## 2018-08-09 LAB — GLUCOSE, CAPILLARY
Glucose-Capillary: 79 mg/dL (ref 70–99)
Glucose-Capillary: 143 mg/dL — ABNORMAL HIGH (ref 70–99)
Glucose-Capillary: 202 mg/dL — ABNORMAL HIGH (ref 70–99)
Glucose-Capillary: 179 mg/dL — ABNORMAL HIGH (ref 70–99)

## 2018-08-09 SURGERY — THYROIDECTOMY
Anesthesia: General | Site: Neck

## 2018-08-09 MED ORDER — ROCURONIUM BROMIDE 10 MG/ML (PF) SYRINGE
PREFILLED_SYRINGE | INTRAVENOUS | Status: DC | PRN
  Administered 2018-08-09: 60 mg via INTRAVENOUS
  Administered 2018-08-09: 10 mg via INTRAVENOUS

## 2018-08-09 MED ORDER — DEXAMETHASONE SODIUM PHOSPHATE 10 MG/ML IJ SOLN
INTRAMUSCULAR | Status: DC | PRN
  Administered 2018-08-09: 4 mg via INTRAVENOUS

## 2018-08-09 MED ORDER — METFORMIN HCL 500 MG PO TABS
1000.0000 mg | ORAL_TABLET | Freq: Two times a day (BID) | ORAL | Status: DC
  Administered 2018-08-09 – 2018-08-10 (×2): 1000 mg via ORAL
  Filled 2018-08-09 (×2): qty 2

## 2018-08-09 MED ORDER — AMLODIPINE BESYLATE 10 MG PO TABS
10.0000 mg | ORAL_TABLET | Freq: Every day | ORAL | Status: DC
  Administered 2018-08-10: 08:00:00 10 mg via ORAL
  Filled 2018-08-09: qty 1

## 2018-08-09 MED ORDER — PROPOFOL 10 MG/ML IV BOLUS
INTRAVENOUS | Status: DC | PRN
  Administered 2018-08-09: 170 mg via INTRAVENOUS

## 2018-08-09 MED ORDER — FENTANYL CITRATE (PF) 250 MCG/5ML IJ SOLN
INTRAMUSCULAR | Status: AC
  Filled 2018-08-09: qty 5

## 2018-08-09 MED ORDER — SITAGLIPTIN PHOS-METFORMIN HCL 50-1000 MG PO TABS: 1.0000 | ORAL_TABLET | Freq: Two times a day (BID) | ORAL | Status: DC

## 2018-08-09 MED ORDER — ONDANSETRON 4 MG PO TBDP: 4.0000 mg | ORAL_TABLET | Freq: Four times a day (QID) | ORAL | Status: DC | PRN

## 2018-08-09 MED ORDER — FENTANYL CITRATE (PF) 100 MCG/2ML IJ SOLN
25.0000 ug | INTRAMUSCULAR | Status: DC | PRN
  Administered 2018-08-09 (×3): 50 ug via INTRAVENOUS

## 2018-08-09 MED ORDER — CEFAZOLIN SODIUM-DEXTROSE 2-4 GM/100ML-% IV SOLN
2.0000 g | INTRAVENOUS | Status: AC
  Administered 2018-08-09: 2 g via INTRAVENOUS
  Filled 2018-08-09: qty 100

## 2018-08-09 MED ORDER — HYDROMORPHONE HCL 1 MG/ML IJ SOLN
1.0000 mg | INTRAMUSCULAR | Status: DC | PRN
  Administered 2018-08-09: 13:00:00 1 mg via INTRAVENOUS
  Filled 2018-08-09: qty 1

## 2018-08-09 MED ORDER — ACETAMINOPHEN 325 MG PO TABS: 650.0000 mg | ORAL_TABLET | Freq: Four times a day (QID) | ORAL | Status: DC | PRN

## 2018-08-09 MED ORDER — FENTANYL CITRATE (PF) 100 MCG/2ML IJ SOLN
INTRAMUSCULAR | Status: AC
  Filled 2018-08-09: qty 4

## 2018-08-09 MED ORDER — 0.9 % SODIUM CHLORIDE (POUR BTL) OPTIME
TOPICAL | Status: DC | PRN
  Administered 2018-08-09: 1000 mL

## 2018-08-09 MED ORDER — SUGAMMADEX SODIUM 200 MG/2ML IV SOLN
INTRAVENOUS | Status: AC
  Filled 2018-08-09: qty 2

## 2018-08-09 MED ORDER — BENAZEPRIL HCL 20 MG PO TABS
40.0000 mg | ORAL_TABLET | Freq: Every day | ORAL | Status: DC
  Administered 2018-08-10: 08:00:00 40 mg via ORAL
  Filled 2018-08-09: qty 2

## 2018-08-09 MED ORDER — TRAMADOL HCL 50 MG PO TABS: 50.0000 mg | ORAL_TABLET | Freq: Four times a day (QID) | ORAL | Status: DC | PRN

## 2018-08-09 MED ORDER — MIDAZOLAM HCL 5 MG/5ML IJ SOLN
INTRAMUSCULAR | Status: DC | PRN
  Administered 2018-08-09: 2 mg via INTRAVENOUS

## 2018-08-09 MED ORDER — SUGAMMADEX SODIUM 200 MG/2ML IV SOLN
INTRAVENOUS | Status: DC | PRN
  Administered 2018-08-09: 200 mg via INTRAVENOUS

## 2018-08-09 MED ORDER — OXYCODONE HCL 5 MG PO TABS
5.0000 mg | ORAL_TABLET | ORAL | Status: DC | PRN
  Administered 2018-08-09 – 2018-08-10 (×2): 10 mg via ORAL
  Filled 2018-08-09 (×2): qty 2

## 2018-08-09 MED ORDER — FENTANYL CITRATE (PF) 100 MCG/2ML IJ SOLN
INTRAMUSCULAR | Status: AC
  Filled 2018-08-09: qty 2

## 2018-08-09 MED ORDER — METOCLOPRAMIDE HCL 5 MG/ML IJ SOLN: 10.0000 mg | Freq: Once | INTRAMUSCULAR | Status: DC | PRN

## 2018-08-09 MED ORDER — CHLORHEXIDINE GLUCONATE CLOTH 2 % EX PADS: 6.0000 | MEDICATED_PAD | Freq: Once | CUTANEOUS | Status: DC

## 2018-08-09 MED ORDER — KCL IN DEXTROSE-NACL 20-5-0.45 MEQ/L-%-% IV SOLN
INTRAVENOUS | Status: AC
  Filled 2018-08-09: qty 1000

## 2018-08-09 MED ORDER — ONDANSETRON HCL 4 MG/2ML IJ SOLN
INTRAMUSCULAR | Status: DC | PRN
  Administered 2018-08-09: 4 mg via INTRAVENOUS

## 2018-08-09 MED ORDER — FENTANYL CITRATE (PF) 100 MCG/2ML IJ SOLN
INTRAMUSCULAR | Status: DC | PRN
  Administered 2018-08-09: 50 ug via INTRAVENOUS
  Administered 2018-08-09 (×2): 25 ug via INTRAVENOUS
  Administered 2018-08-09: 50 ug via INTRAVENOUS

## 2018-08-09 MED ORDER — INSULIN ASPART 100 UNIT/ML ~~LOC~~ SOLN
0.0000 [IU] | Freq: Three times a day (TID) | SUBCUTANEOUS | Status: DC
  Administered 2018-08-09: 17:00:00 5 [IU] via SUBCUTANEOUS

## 2018-08-09 MED ORDER — LIDOCAINE 2% (20 MG/ML) 5 ML SYRINGE
INTRAMUSCULAR | Status: DC | PRN
  Administered 2018-08-09: 80 mg via INTRAVENOUS

## 2018-08-09 MED ORDER — HYDROMORPHONE HCL 1 MG/ML IJ SOLN
0.2500 mg | INTRAMUSCULAR | Status: DC | PRN
  Administered 2018-08-09 (×2): 0.5 mg via INTRAVENOUS

## 2018-08-09 MED ORDER — LINAGLIPTIN 5 MG PO TABS
5.0000 mg | ORAL_TABLET | Freq: Every day | ORAL | Status: DC
  Administered 2018-08-10: 5 mg via ORAL
  Filled 2018-08-09: qty 1

## 2018-08-09 MED ORDER — PHENYLEPHRINE 40 MCG/ML (10ML) SYRINGE FOR IV PUSH (FOR BLOOD PRESSURE SUPPORT)
PREFILLED_SYRINGE | INTRAVENOUS | Status: AC
  Filled 2018-08-09: qty 10

## 2018-08-09 MED ORDER — PHENYLEPHRINE 40 MCG/ML (10ML) SYRINGE FOR IV PUSH (FOR BLOOD PRESSURE SUPPORT)
PREFILLED_SYRINGE | INTRAVENOUS | Status: DC | PRN
  Administered 2018-08-09 (×5): 80 ug via INTRAVENOUS
  Administered 2018-08-09: 40 ug via INTRAVENOUS

## 2018-08-09 MED ORDER — MIDAZOLAM HCL 2 MG/2ML IJ SOLN
INTRAMUSCULAR | Status: AC
  Filled 2018-08-09: qty 2

## 2018-08-09 MED ORDER — ONDANSETRON HCL 4 MG/2ML IJ SOLN: 4.0000 mg | Freq: Four times a day (QID) | INTRAMUSCULAR | Status: DC | PRN

## 2018-08-09 MED ORDER — HYDROMORPHONE HCL 1 MG/ML IJ SOLN
INTRAMUSCULAR | Status: AC
  Filled 2018-08-09: qty 1

## 2018-08-09 MED ORDER — KCL IN DEXTROSE-NACL 20-5-0.45 MEQ/L-%-% IV SOLN
INTRAVENOUS | Status: DC
  Administered 2018-08-09: 13:00:00 via INTRAVENOUS
  Filled 2018-08-09: qty 1000

## 2018-08-09 MED ORDER — GLIMEPIRIDE 4 MG PO TABS
4.0000 mg | ORAL_TABLET | Freq: Every day | ORAL | Status: DC
  Administered 2018-08-10: 08:00:00 4 mg via ORAL
  Filled 2018-08-09: qty 1

## 2018-08-09 MED ORDER — ROCURONIUM BROMIDE 10 MG/ML (PF) SYRINGE
PREFILLED_SYRINGE | INTRAVENOUS | Status: AC
  Filled 2018-08-09: qty 10

## 2018-08-09 MED ORDER — DEXAMETHASONE SODIUM PHOSPHATE 10 MG/ML IJ SOLN
INTRAMUSCULAR | Status: AC
  Filled 2018-08-09: qty 1

## 2018-08-09 MED ORDER — LIDOCAINE 2% (20 MG/ML) 5 ML SYRINGE
INTRAMUSCULAR | Status: AC
  Filled 2018-08-09: qty 5

## 2018-08-09 MED ORDER — METOPROLOL TARTRATE 50 MG PO TABS
50.0000 mg | ORAL_TABLET | Freq: Every day | ORAL | Status: DC
  Administered 2018-08-10: 08:00:00 50 mg via ORAL
  Filled 2018-08-09: qty 1

## 2018-08-09 MED ORDER — LACTATED RINGERS IV SOLN: INTRAVENOUS | Status: DC

## 2018-08-09 MED ORDER — LACTATED RINGERS IV SOLN
INTRAVENOUS | Status: DC
  Administered 2018-08-09: 07:00:00 via INTRAVENOUS

## 2018-08-09 MED ORDER — ACETAMINOPHEN 650 MG RE SUPP: 650.0000 mg | Freq: Four times a day (QID) | RECTAL | Status: DC | PRN

## 2018-08-09 MED ORDER — AMLODIPINE BESY-BENAZEPRIL HCL 10-40 MG PO CAPS: 1.0000 | ORAL_CAPSULE | Freq: Every day | ORAL | Status: DC

## 2018-08-09 MED ORDER — PROPOFOL 10 MG/ML IV BOLUS
INTRAVENOUS | Status: AC
  Filled 2018-08-09: qty 20

## 2018-08-09 MED ORDER — MEPERIDINE HCL 50 MG/ML IJ SOLN: 6.2500 mg | INTRAMUSCULAR | Status: DC | PRN

## 2018-08-09 MED ORDER — ONDANSETRON HCL 4 MG/2ML IJ SOLN
INTRAMUSCULAR | Status: AC
  Filled 2018-08-09: qty 2

## 2018-08-09 SURGICAL SUPPLY — 28 items
ATTRACTOMAT 16X20 MAGNETIC DRP (DRAPES) ×2 IMPLANT
BLADE SURG 15 STRL LF DISP TIS (BLADE) ×1 IMPLANT
BLADE SURG 15 STRL SS (BLADE) ×1
CHLORAPREP W/TINT 26 (MISCELLANEOUS) ×4 IMPLANT
CLIP VESOCCLUDE MED 6/CT (CLIP) ×4 IMPLANT
CLIP VESOCCLUDE SM WIDE 6/CT (CLIP) ×4 IMPLANT
COVER SURGICAL LIGHT HANDLE (MISCELLANEOUS) ×2 IMPLANT
COVER WAND RF STERILE (DRAPES) ×2 IMPLANT
DERMABOND ADVANCED (GAUZE/BANDAGES/DRESSINGS) ×1
DERMABOND ADVANCED .7 DNX12 (GAUZE/BANDAGES/DRESSINGS) ×1 IMPLANT
DRAPE LAPAROTOMY T 98X78 PEDS (DRAPES) ×2 IMPLANT
ELECT PENCIL ROCKER SW 15FT (MISCELLANEOUS) ×2 IMPLANT
ELECT REM PT RETURN 15FT ADLT (MISCELLANEOUS) ×2 IMPLANT
GAUZE 4X4 16PLY RFD (DISPOSABLE) ×2 IMPLANT
GLOVE SURG ORTHO 8.0 STRL STRW (GLOVE) ×2 IMPLANT
GOWN STRL REUS W/TWL XL LVL3 (GOWN DISPOSABLE) ×4 IMPLANT
HEMOSTAT SURGICEL 2X4 FIBR (HEMOSTASIS) IMPLANT
ILLUMINATOR WAVEGUIDE N/F (MISCELLANEOUS) IMPLANT
KIT BASIN OR (CUSTOM PROCEDURE TRAY) ×2 IMPLANT
KIT TURNOVER KIT A (KITS) IMPLANT
PACK BASIC VI WITH GOWN DISP (CUSTOM PROCEDURE TRAY) ×2 IMPLANT
SHEARS HARMONIC 9CM CVD (BLADE) ×2 IMPLANT
SUT MNCRL AB 4-0 PS2 18 (SUTURE) ×2 IMPLANT
SUT VIC AB 3-0 SH 18 (SUTURE) ×4 IMPLANT
SYR BULB IRRIGATION 50ML (SYRINGE) ×2 IMPLANT
TOWEL OR 17X26 10 PK STRL BLUE (TOWEL DISPOSABLE) ×2 IMPLANT
TOWEL OR NON WOVEN STRL DISP B (DISPOSABLE) ×2 IMPLANT
TUBING CONNECTING 10 (TUBING) ×2 IMPLANT

## 2018-08-09 NOTE — Anesthesia Postprocedure Evaluation (Signed)
Anesthesia Post Note  Patient: Terri Klein  Procedure(s) Performed: TOTAL THYROIDECTOMY (N/A Neck)     Patient location during evaluation: PACU Anesthesia Type: General Level of consciousness: awake and alert Pain management: pain level controlled Vital Signs Assessment: post-procedure vital signs reviewed and stable Respiratory status: spontaneous breathing, nonlabored ventilation, respiratory function stable and patient connected to nasal cannula oxygen Cardiovascular status: blood pressure returned to baseline and stable Postop Assessment: no apparent nausea or vomiting Anesthetic complications: no    Last Vitals:  Vitals:   08/09/18 1228 08/09/18 1318  BP: 138/84 131/82  Pulse: 84 83  Resp: 16 18  Temp: 36.9 C 36.8 C  SpO2: 94% 90%    Last Pain:  Vitals:   08/09/18 1318  TempSrc: Oral  PainSc:                  Montez Hageman

## 2018-08-09 NOTE — Op Note (Signed)
Procedure Note  Pre-operative Diagnosis:  Thyroid neoplasm of uncertain behavior, multiple thyroid nodules  Post-operative Diagnosis:  same  Surgeon:  Armandina Gemma, MD  Assistant:  Erroll Luna, MD   Procedure:  Total thyroidectomy  Anesthesia:  General  Estimated Blood Loss:  minimal  Drains: none         Specimen: thyroid to pathology  Indications:  Patient had been evaluated in February. At my request she underwent an ultrasound which was performed on May 08, 2018. This showed multiple bilateral thyroid nodules. 2 nodules were suspicious. She subsequently underwent fine-needle aspiration biopsy on May 28, 2018 of both of these nodules. The nodule in the right lobe was benign. However the nodule in the thyroid isthmus was significant for atypia and molecular genetic testing was obtained. AFIRMA test was suspicious rendering a risk of malignancy of 50%. Patient returns today to discuss these results and to discuss my recommendation for thyroidectomy.   Procedure Details: Procedure was done in OR #2 at the [facility]. The patient was brought to the operating room and placed in a supine position on the operating room table. Following administration of general anesthesia, the patient was positioned and then prepped and draped in the usual aseptic fashion. After ascertaining that an adequate level of anesthesia had been achieved, a small Kocher incision was made with #15 blade. Dissection was carried through subcutaneous tissues and platysma.Hemostasis was achieved with the electrocautery. Skin flaps were elevated cephalad and caudad from the thyroid notch to the sternal notch. A Mahorner self-retaining retractor was placed for exposure. Strap muscles were incised in the midline and dissection was begun on the left side.  Strap muscles were reflected laterally.  Left thyroid lobe was moderately enlarged with a dominant mass.  The left lobe was gently mobilized with blunt  dissection. Superior pole vessels were dissected out and divided individually between small and medium ligaclips with the harmonic scalpel. The thyroid lobe was rolled anteriorly. Branches of the inferior thyroid artery were divided between small ligaclips with the harmonic scalpel. Inferior venous tributaries were divided between ligaclips. Both the superior and inferior parathyroid glands were identified and preserved on their vascular pedicles. The recurrent laryngeal nerve was identified and preserved along its course. The ligament of Gwenlyn Found was released with the electrocautery and the gland was mobilized onto the anterior trachea. Isthmus was mobilized across the midline. There was a moderate sized pyramidal lobe present which was dissected off of the thyroid cartilage and resected with the isthmus. Dry pack was placed in the left neck.  The right thyroid lobe was gently mobilized with blunt dissection. Right thyroid lobe was moderately enlarged with nodules. Superior pole vessels were dissected out and divided between small and medium ligaclips with the Harmonic scalpel. Superior parathyroid was identified and preserved. Inferior venous tributaries were divided between medium ligaclips with the harmonic scalpel. The right thyroid lobe was rolled anteriorly and the branches of the inferior thyroid artery divided between small ligaclips. The right recurrent laryngeal nerve was identified and preserved along its course. The ligament of Gwenlyn Found was released with the electrocautery. The right thyroid lobe was mobilized onto the anterior trachea and the remainder of the thyroid was dissected off the anterior trachea and the thyroid was completely excised. A suture was used to mark the right lobe. The entire thyroid gland was submitted to pathology for review.  The neck was irrigated with warm saline. Fibrillar was placed throughout the operative field. Strap muscles were approximated in the midline  with interrupted  3-0 Vicryl sutures. Platysma was closed with interrupted 3-0 Vicryl sutures. Skin was closed with a running 4-0 Monocryl subcuticular suture. Wound was washed and Dermabond was applied. The patient was awakened from anesthesia and brought to the recovery room. The patient tolerated the procedure well.   Armandina Gemma, MD [facility] Office: [phone]

## 2018-08-09 NOTE — Interval H&P Note (Signed)
History and Physical Interval Note:  08/09/2018 7:17 AM  Terri Klein  has presented today for surgery, with the diagnosis of THYROID NEOPLASM OF UNCERTAIN BEHAVIOR.  The various methods of treatment have been discussed with the patient and family. After consideration of risks, benefits and other options for treatment, the patient has consented to    Procedure(s): TOTAL THYROIDECTOMY (N/A) as a surgical intervention.    The patient's history has been reviewed, patient examined, no change in status, stable for surgery.  I have reviewed the patient's chart and labs.  Questions were answered to the patient's satisfaction.    Armandina Gemma, [facility] Office: [place]

## 2018-08-09 NOTE — Transfer of Care (Signed)
Immediate Anesthesia Transfer of Care Note  Patient: Terri Klein  Procedure(s) Performed: TOTAL THYROIDECTOMY (N/A Neck)  Patient Location: PACU  Anesthesia Type:General  Level of Consciousness: awake, alert , oriented and patient cooperative  Airway & Oxygen Therapy: Patient Spontanous Breathing and Patient connected to face mask oxygen  Post-op Assessment: Report given to RN, Post -op Vital signs reviewed and stable and Patient moving all extremities  Post vital signs: Reviewed and stable  Last Vitals:  Vitals Value Taken Time  BP 135/81 08/09/2018  9:28 AM  Temp    Pulse 81 08/09/2018  9:29 AM  Resp 16 08/09/2018  9:29 AM  SpO2 100 % 08/09/2018  9:29 AM  Vitals shown include unvalidated device data.  Last Pain:  Vitals:   08/09/18 0617  TempSrc: Oral  PainSc:          Complications: No apparent anesthesia complications

## 2018-08-09 NOTE — Anesthesia Preprocedure Evaluation (Signed)
Anesthesia Evaluation  Patient identified by MRN, date of birth, ID band Patient awake    Reviewed: Allergy & Precautions, NPO status , Patient's Chart, lab work & pertinent test results  Airway Mallampati: II  TM Distance: >3 FB Neck ROM: Full    Dental no notable dental hx.    Pulmonary neg pulmonary ROS,    Pulmonary exam normal breath sounds clear to auscultation       Cardiovascular hypertension, Pt. on medications negative cardio ROS Normal cardiovascular exam Rhythm:Regular Rate:Normal     Neuro/Psych negative neurological ROS  negative psych ROS   GI/Hepatic negative GI ROS, Neg liver ROS,   Endo/Other  diabetes, Type 2, Oral Hypoglycemic Agents  Renal/GU negative Renal ROS  negative genitourinary   Musculoskeletal negative musculoskeletal ROS (+)   Abdominal   Peds negative pediatric ROS (+)  Hematology negative hematology ROS (+)   Anesthesia Other Findings   Reproductive/Obstetrics negative OB ROS                             Anesthesia Physical Anesthesia Plan  ASA: II  Anesthesia Plan: General   Post-op Pain Management:    Induction: Intravenous  PONV Risk Score and Plan: 3 and Ondansetron, Dexamethasone and Midazolam  Airway Management Planned: Oral ETT  Additional Equipment:   Intra-op Plan:   Post-operative Plan: Extubation in OR  Informed Consent: I have reviewed the patients History and Physical, chart, labs and discussed the procedure including the risks, benefits and alternatives for the proposed anesthesia with the patient or authorized representative who has indicated his/her understanding and acceptance.     Dental advisory given  Plan Discussed with: CRNA  Anesthesia Plan Comments:         Anesthesia Quick Evaluation

## 2018-08-09 NOTE — Anesthesia Procedure Notes (Signed)
Procedure Name: Intubation Date/Time: 08/09/2018 7:36 AM Performed by: Victoriano Lain, CRNA Pre-anesthesia Checklist: Patient identified, Emergency Drugs available, Suction available, Patient being monitored and Timeout performed Patient Re-evaluated:Patient Re-evaluated prior to induction Oxygen Delivery Method: Circle system utilized Preoxygenation: Pre-oxygenation with 100% oxygen Induction Type: IV induction Ventilation: Mask ventilation without difficulty Laryngoscope Size: Mac and 4 Grade View: Grade I Tube type: Oral Tube size: 7.5 mm Number of attempts: 1 Airway Equipment and Method: Stylet Placement Confirmation: ETT inserted through vocal cords under direct vision,  positive ETCO2 and breath sounds checked- equal and bilateral Secured at: 20 cm Tube secured with: Tape Dental Injury: Teeth and Oropharynx as per pre-operative assessment

## 2018-08-10 ENCOUNTER — Encounter (HOSPITAL_COMMUNITY): Payer: Self-pay | Admitting: Surgery

## 2018-08-10 DIAGNOSIS — C73 Malignant neoplasm of thyroid gland: Secondary | ICD-10-CM | POA: Diagnosis not present

## 2018-08-10 LAB — BASIC METABOLIC PANEL
Anion gap: 12 (ref 5–15)
BUN: 11 mg/dL (ref 6–20)
CO2: 25 mmol/L (ref 22–32)
Calcium: 7.8 mg/dL — ABNORMAL LOW (ref 8.9–10.3)
Chloride: 103 mmol/L (ref 98–111)
Creatinine, Ser: 0.61 mg/dL (ref 0.44–1.00)
GFR calc Af Amer: >60 mL/min (ref >60)
GFR calc non Af Amer: >60 mL/min (ref >60)
Glucose, Bld: 143 mg/dL — ABNORMAL HIGH (ref 70–99)
Potassium: 3.6 mmol/L (ref 3.5–5.1)
Sodium: 140 mmol/L (ref 135–145)

## 2018-08-10 LAB — GLUCOSE, CAPILLARY: Glucose-Capillary: 136 mg/dL — ABNORMAL HIGH (ref 70–99)

## 2018-08-10 MED ORDER — CALCIUM CARBONATE ANTACID 500 MG PO CHEW: 2.0000 | CHEWABLE_TABLET | Freq: Three times a day (TID) | ORAL | 1 refills | Status: DC

## 2018-08-10 MED ORDER — SODIUM CHLORIDE 0.9 % IV SOLN
2.0000 g | INTRAVENOUS | Status: AC
  Administered 2018-08-10: 09:00:00 2 g via INTRAVENOUS
  Filled 2018-08-10: qty 20

## 2018-08-10 MED ORDER — CALCIUM CARBONATE 1250 (500 CA) MG PO TABS
2.0000 | ORAL_TABLET | ORAL | Status: AC
  Administered 2018-08-10: 1000 mg via ORAL
  Filled 2018-08-10: qty 1

## 2018-08-10 MED ORDER — LEVOTHYROXINE SODIUM 88 MCG PO TABS: 88.0000 ug | ORAL_TABLET | Freq: Every day | ORAL | 3 refills | Status: DC

## 2018-08-10 MED ORDER — TRAMADOL HCL 50 MG PO TABS: 50.0000 mg | ORAL_TABLET | Freq: Four times a day (QID) | ORAL | 0 refills | Status: DC | PRN

## 2018-08-10 NOTE — Discharge Summary (Signed)
Physician Discharge Summary [facility]  Patient ID: Terri Klein MRN: [id number] DOB/AGE: [birth year] 58 y.o.  Admit date: 08/09/2018 Discharge date: 08/10/2018  Admission Diagnoses:  Thyroid neoplasm of uncertain behavior, multiple thyroid nodules  Discharge Diagnoses:  Principal Problem:   Neoplasm of uncertain behavior of thyroid gland Active Problems:   Multiple thyroid nodules   Discharged Condition: good  Hospital Course: Patient was admitted for observation following thyroid surgery.  Post op course was uncomplicated.  Pain was well controlled.  Tolerated diet.  Post op calcium level on morning following surgery was 7.8 mg/dl.  Patient was administered IV calcium gluconate and po calcium carbonate prior to discharge.  Patient was prepared for discharge home on POD#1.  Consults: None  Treatments: surgery: total thyroidectomy  Discharge Exam: Blood pressure (!) 153/85, pulse 98, temperature 98.4 F (36.9 C), temperature source Oral, resp. rate 16, height 5\' 7"  (1.702 m), weight 98.9 kg, last menstrual period 04/20/2008, SpO2 98 %. HEENT - clear Neck - wound dry and intact; mild STS; voice normal; Dermabond in place Chest - clear bilaterally Cor - RRR  Disposition: Home  Discharge Instructions    Diet - low sodium heart healthy   Complete by:  As directed    Discharge instructions   Complete by:  As directed    [facility]  THYROID & PARATHYROID SURGERY:  POST-OP INSTRUCTIONS  Always review your discharge instruction sheet from the facility where your surgery was performed.  A prescription for pain medication may be given to you upon discharge.  Take your pain medication as prescribed.  If narcotic pain medicine is not needed, then you may take acetaminophen (Tylenol) or ibuprofen (Advil) as needed.  Take your usually prescribed medications unless otherwise directed.  If you need a refill on your pain medication, please  contact our office during regular business hours.  Prescriptions cannot be processed by our office after 5 pm or on weekends.  Start with a light diet upon arrival home, such as soup and crackers or toast.  Be sure to drink plenty of fluids daily.  Resume your normal diet the day after surgery.  Most patients will experience some swelling and bruising on the chest and neck area.  Ice packs will help.  Swelling and bruising can take several days to resolve.   It is common to experience some constipation after surgery.  Increasing fluid intake and taking a stool softener (Colace) will usually help or prevent this problem.  A mild laxative (Milk of Magnesia or Miralax) should be taken according to package directions if there has been no bowel movement after 48 hours.  You have steri-strips and a gauze dressing over your incision.  You may remove the gauze bandage on the second day after surgery, and you may shower at that time.  Leave your steri-strips (small skin tapes) in place directly over the incision.  These strips should remain on the skin for 5-7 days and then be removed.  You may get them wet in the shower and pat them dry.  You may resume regular (light) daily activities beginning the next day (such as daily self-care, walking, climbing stairs) gradually increasing activities as tolerated.  You may have sexual intercourse when it is comfortable.  Refrain from any heavy lifting or straining until approved by your doctor.  You may drive when you no longer are taking prescription pain medication, you can comfortably wear a seatbelt, and you can safely maneuver your  car and apply brakes.  You should see your doctor in the office for a follow-up appointment approximately three weeks after your surgery.  Make sure that you call for this appointment within a day or two after you arrive home to insure a convenient appointment time.  WHEN TO CALL YOUR DOCTOR: -- Fever greater than 101.5 -- Inability to  urinate -- Nausea and/or vomiting - persistent -- Extreme swelling or bruising -- Continued bleeding from incision -- Increased pain, redness, or drainage from the incision -- Difficulty swallowing or breathing -- Muscle cramping or spasms -- Numbness or tingling in hands or around lips  The clinic staff is available to answer your questions during regular business hours.  Please don't hesitate to call and ask to speak to one of the nurses if you have concerns.  Armandina Gemma, MD [facility] Office: [phone]   Increase activity slowly   Complete by:  As directed    No dressing needed   Complete by:  As directed      Allergies as of 08/10/2018      Reactions   Iodine    immobilized      Medication List    TAKE these medications   amLODipine-benazepril 10-40 MG capsule Commonly known as:  LOTREL Take 1 capsule by mouth daily.   aspirin EC 81 MG tablet Take 1 tablet (81 mg total) by mouth daily.   atorvastatin 20 MG tablet Commonly known as:  LIPITOR Take 20 mg by mouth daily.   BLACK ELDERBERRY PO Take 1 tablet by mouth every other day.   calcium carbonate 500 MG chewable tablet Commonly known as:  Tums Chew 2 tablets (400 mg of elemental calcium total) by mouth 3 (three) times daily.   glimepiride 4 MG tablet Commonly known as:  AMARYL Take 1 tablet (4 mg total) by mouth daily with breakfast.   levothyroxine 88 MCG tablet Commonly known as:  Synthroid Take 1 tablet (88 mcg total) by mouth daily before breakfast.   metFORMIN 1000 MG tablet Commonly known as:  Glucophage Take 1 tablet (1,000 mg total) by mouth daily with breakfast.   metoprolol tartrate 50 MG tablet Commonly known as:  Lopressor Take 1 tablet (50 mg total) by mouth daily.   multivitamin-iron-minerals-folic acid chewable tablet Chew 1 tablet by mouth daily.   sitaGLIPtin 100 MG tablet Commonly known as:  Januvia Take 1 tablet (100 mg total) by mouth daily.    sitaGLIPtin-metformin 50-1000 MG tablet Commonly known as:  JANUMET Take 1 tablet by mouth 2 (two) times a day.   traMADol 50 MG tablet Commonly known as:  ULTRAM Take 1-2 tablets (50-100 mg total) by mouth every 6 (six) hours as needed.      Follow-up Information    Armandina Gemma, MD. Schedule an appointment as soon as possible for a visit in 3 week(s).   Specialty:  General Surgery Contact information: [address] [place] [address] [phone]           Earnstine Regal, MD, [facility] Office: [phone]   Signed: Armandina Gemma 08/10/2018, 8:26 AM

## 2018-08-10 NOTE — Plan of Care (Signed)
  Problem: Education: Goal: Knowledge of General Education information will improve Description Including pain rating scale, medication(s)/side effects and non-pharmacologic comfort measures Outcome: Adequate for Discharge   Problem: Health Behavior/Discharge Planning: Goal: Ability to manage health-related needs will improve Outcome: Adequate for Discharge   Problem: Clinical Measurements: Goal: Ability to maintain clinical measurements within normal limits will improve Outcome: Adequate for Discharge Goal: Will remain free from infection Outcome: Adequate for Discharge Goal: Diagnostic test results will improve Outcome: Adequate for Discharge Goal: Respiratory complications will improve Outcome: Adequate for Discharge Goal: Cardiovascular complication will be avoided Outcome: Adequate for Discharge   Problem: Activity: Goal: Risk for activity intolerance will decrease Outcome: Adequate for Discharge   Problem: Nutrition: Goal: Adequate nutrition will be maintained Outcome: Adequate for Discharge   Problem: Coping: Goal: Level of anxiety will decrease Outcome: Adequate for Discharge   Problem: Elimination: Goal: Will not experience complications related to bowel motility Outcome: Adequate for Discharge Goal: Will not experience complications related to urinary retention Outcome: Adequate for Discharge   Problem: Pain Managment: Goal: General experience of comfort will improve Outcome: Adequate for Discharge   Problem: Safety: Goal: Ability to remain free from injury will improve Outcome: Adequate for Discharge   Problem: Skin Integrity: Goal: Risk for impaired skin integrity will decrease Outcome: Adequate for Discharge  Home with daughter today. Discharge teaching done. Written information given.

## 2018-09-11 ENCOUNTER — Encounter: Payer: Self-pay | Admitting: Internal Medicine

## 2018-09-11 ENCOUNTER — Ambulatory Visit (INDEPENDENT_AMBULATORY_CARE_PROVIDER_SITE_OTHER): Payer: BC Managed Care – PPO | Admitting: Internal Medicine

## 2018-09-11 ENCOUNTER — Other Ambulatory Visit: Payer: Self-pay

## 2018-09-11 VITALS — BP 120/70 | HR 75 | Ht 67.0 in | Wt 218.0 lb

## 2018-09-11 DIAGNOSIS — C73 Malignant neoplasm of thyroid gland: Secondary | ICD-10-CM

## 2018-09-11 DIAGNOSIS — E890 Postprocedural hypothyroidism: Secondary | ICD-10-CM | POA: Diagnosis not present

## 2018-09-11 LAB — T4, FREE: Free T4: 0.74 ng/dL (ref 0.60–1.60)

## 2018-09-11 LAB — CALCIUM: Calcium: 9.2 mg/dL (ref 8.4–10.5)

## 2018-09-11 LAB — VITAMIN D 25 HYDROXY (VIT D DEFICIENCY, FRACTURES): VITD: 41.7 ng/mL (ref 30.00–100.00)

## 2018-09-11 LAB — TSH: TSH: 12.96 u[IU]/mL — ABNORMAL HIGH (ref 0.35–4.50)

## 2018-09-11 NOTE — Patient Instructions (Signed)
Please stop at the lab.  Continue Levothyroxine 88 mcg daily.  Take the thyroid hormone every day, with water, at least 30 minutes before breakfast, separated by at least 4 hours from: - acid reflux medications - calcium - iron - multivitamins  Please come back for a follow-up appointment in 6 months.

## 2018-09-11 NOTE — Progress Notes (Signed)
Patient ID: Terri Klein, female   DOB: 02/17/1961, 58 y.o.   MRN: [id number]    HPI  Terri Klein is a 58 y.o.-year-old female, referred by Dr. Harlow Asa, for management of thyroid cancer and postsurgical hypothyroidism. She moved to the area from [place] in 2015.  Pt. has been dx with thyroid cancer after she found a neck mass in 03/2018 by looking in the mirror.  Retrospectively, she also had some dysphagia.   05/08/2018: Thyroid ultrasound showed several nodules of which 2 were biopsied  05/28/2018: FNA: Right inferior 1.5 cm; Othrer 2 dimensions: 1.4 x 1.1cm, Solid / almost completeley solid, Hypoechoic, TI-RADS total points 4  >> benign Isthmus mid 2.1cm; Other 2 dimensions: 1.7 x 1.2cm, Solid / almost completely solid, Hypoechoic, TI-RADS total points 4 >> ATYPIA OF UNDETERMINED SIGNIFICANCE OR FOLLICULAR LESION OF UNDETERMINED SIGNIFICANCE (BETHESDA CATEGORY III) >> Afirma: suspicious (malignancy risk 50%)  08/09/2018: Total thyroidectomy: Diagnosis Thyroid, thyroidectomy, Total - PAPILLARY THYROID CARCINOMA, FOLLICULAR VARIANT, 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 - CARCINOMA IS CONFINED TO THE THYROID. - FOLLICULAR ADENOMAS. - ONE BENIGN LYMPH NODE (0/1). - SEE ONCOLOGY TABLE BELOW. Microscopic Comment THYROID GLAND: Procedure: Thyroidectomy. Tumor Focality: Unifocal. Tumor Site: Isthmus. Tumor Size: 2.2 cm, gross measurement. Histologic Type: Papillary thyroid carcinoma, follicular variant. Margins: Negative for carcinoma. Angioinvasion: Not identified. Lymphatic Invasion: Not identified. Extrathyroidal: Not identified. Regional Lymph Nodes: Number of Lymph Nodes Involved: 0 of 1. Pathologic Stage Classification (pTNM, AJCC 8th Edition): pT2, pN0.  For her postsurgical hypothyroidism, she is on Levothyroxine 88 mcg, taken: - fasting, at 4 am - with water - separated by >2h from b'fast  - no calcium, iron, PPIs - + multivitamin 3-5 hours later Her calcium postop was 7.9 >> 8.9 postop.  She stopped Tums at the indication of Dr. Harlow Asa on 08/27/2018.    No available thyroid tests: No results found for: TSH, FREET4   Pt denies: - feeling nodules in neck - hoarseness - choking - SOB with lying down She has dysphagia  - persistent after the surgery. Mostly with hard foods and PB.  Pt denies: - fatigue - weight gain - cold intolerance; she has hot flashes - dry skin - hair loss - depression  She has no FH of thyroid disorders. No FH of thyroid cancer.  No h/o radiation tx to head or neck.  She had 2 parathyroid glands resected >30 years ago for hyperparathyroidism causing her kidney stones.  Calcium normalized afterwards.  She also has a h/o type 2 diabetes.  Latest HbA1c reviewed: 08/02/2018: HbA1c 6.3%   ROS: Constitutional: no weight gain/loss, no fatigue, + hot flushes Eyes: no blurry vision, no xerophthalmia ENT: no sore throat, + see HPI Cardiovascular: no CP/SOB/palpitations/leg swelling Respiratory: no cough/SOB Gastrointestinal: no N/V/D/+ had C while taking calcium, now resolved Musculoskeletal: no muscle/joint aches Skin: no rashes Neurological: no tremors/numbness/tingling/dizziness Psychiatric: no depression/anxiety  Past Medical History:  Diagnosis Date  . Diabetes mellitus without complication ([place])   . Hyperlipidemia   . Hypertension   . Kidney stone    during her 20's   Past Surgical History:  Procedure Laterality Date  . THYROIDECTOMY N/A 08/09/2018   Procedure: TOTAL THYROIDECTOMY;  Surgeon: Armandina Gemma, MD;  Location: [facility];  Service: General;  Laterality: N/A;   Social History   Socioeconomic History  . Marital status: Single    Spouse name: Not on file  . Number of children: 2  . Years of education: Not on file  . Highest education level: Not  on file  Occupational History  . Occupation: custodian  Scientific laboratory technician  . Financial resource strain: Not on file  . Food insecurity    Worry: Not on file    Inability: Not on file   . Transportation needs    Medical: Not on file    Non-medical: Not on file  Tobacco Use  . Smoking status: Never Smoker  . Smokeless tobacco: Never Used  Substance and Sexual Activity  . Alcohol use: Yes    Comment: socially, beer  . Drug use: No  . Sexual activity: Yes    Birth control/protection: Condom   Current Outpatient Medications on File Prior to Visit  Medication Sig Dispense Refill  . amLODipine-benazepril (LOTREL) 10-40 MG capsule Take 1 capsule by mouth daily. 90 capsule 1  . aspirin EC 81 MG tablet Take 1 tablet (81 mg total) by mouth daily. 90 tablet 1  . atorvastatin (LIPITOR) 20 MG tablet Take 20 mg by mouth daily.    Marland Kitchen BLACK ELDERBERRY PO Take 1 tablet by mouth every other day.    Marland Kitchen glimepiride (AMARYL) 4 MG tablet Take 1 tablet (4 mg total) by mouth daily with breakfast. 90 tablet 1  . levothyroxine (SYNTHROID) 88 MCG tablet Take 1 tablet (88 mcg total) by mouth daily before breakfast. 30 tablet 3  . multivitamin-iron-minerals-folic acid (CENTRUM) chewable tablet Chew 1 tablet by mouth daily. 90 tablet 1  . sitaGLIPtin (JANUVIA) 100 MG tablet Take 1 tablet (100 mg total) by mouth daily. 90 tablet 1  . sitaGLIPtin-metformin (JANUMET) 50-1000 MG tablet Take 1 tablet by mouth 2 (two) times a day.    . metoprolol (LOPRESSOR) 50 MG tablet Take 1 tablet (50 mg total) by mouth daily. 90 tablet 1   No current facility-administered medications on file prior to visit.    Allergies  Allergen Reactions  . Iodine     immobilized   Family History  Problem Relation Age of Onset  . Cancer Mother        Cervical  . Diabetes Mother   . Hypertension Mother   . Hyperlipidemia Brother   . Hypertension Maternal Grandmother   . Heart disease Maternal Grandmother   . Diabetes Maternal Grandmother     PE: BP 120/70   Pulse 75   Ht 5\' 7"  (1.702 m)   Wt 218 lb (98.9 kg)   LMP 04/20/2008   SpO2 98%   BMI 34.14 kg/m  Wt Readings from Last 3 Encounters:  09/11/18 218 lb  (98.9 kg)  08/09/18 218 lb 2 oz (98.9 kg)  08/02/18 218 lb 2 oz (98.9 kg)   Constitutional: overweight, in NAD Eyes: PERRLA, EOMI, no exophthalmos ENT: moist mucous membranes, no neck masses palpated, thyroidectomy scar healing, without keloid or dysesthesia at the site, no cervical lymphadenopathy Cardiovascular: RRR, No MRG Respiratory: CTA B Gastrointestinal: abdomen soft, NT, ND, BS+ Musculoskeletal: no deformities, strength intact in all 4 Skin: moist, warm, no rashes Neurological: no tremor with outstretched hands, DTR normal in all 4  ASSESSMENT: 1. Thyroid cancer - see HPI  2. Postsurgical Hypothyroidism  3. Postsurgical hypocalcemia  PLAN:  1. Thyroid cancer - papillary - I had a long discussion with the patient about her recent diagnosis of thyroid cancer. We reviewed together the pathology >> she is stage I TNM (pathology: [id number]), and her cancer is low risk due to being admitted to the thyroid, having negative surgical margins and no worrisome pathologic features. - I reassured her that papillary thyroid cancer  is a slow growing cancer with good prognosis; her life expectancy is unlikely to be reduced due to the cancer.  - Since her cancer is low risk, radioactive iodine is NOT indicated for post-op thyroid remnant ablation.  Patient agrees and appears relieved. - We discussed about follow-up in the long run, by thyroid ultrasounds and also, since she had total thyroidectomy, by monitoring thyroglobulin.  The absolute thyroglobulin value is not as important as the trend. - I will check a thyroglobulin and ATA antibodies today and we plan to check a neck ultrasound in a year from her surgery.  2. Patient with h/o total thyroidectomy for cancer, now with iatrogenic hypothyroidism, on levothyroxine therapy.  - no available recent thyroid labs  - she continues on LT4 88 mcg daily - pt feels good on this dose. - we discussed about taking the thyroid hormone every day, with  water, >30 minutes before breakfast, separated by >4 hours from acid reflux medications, calcium, iron, multivitamins. Pt. is taking it correctly. - will check thyroid tests today: TSH and fT4 - If labs are abnormal, she will need to return for repeat TFTs in 1.5 months - Otherwise, I will see her back in 6 months  3. Postsurgical hypocalcemia - she has a h/o vit D def - has been on once a week high dose vit D in [place] - recheck calcium and vit D now  Component     Latest Ref Rng & Units 09/11/2018  Thyroglobulin     ng/mL 0.3 (L)  Comment        Calcium     8.4 - 10.5 mg/dL 9.2  TSH     0.35 - 4.50 uIU/mL 12.96 (H)  T4,Free(Direct)     0.60 - 1.60 ng/dL 0.74  VITD     30.00 - 100.00 ng/mL 41.70  Thyroglobulin Ab     < or = 1 IU/mL <1  Calcium and vitamin D levels are normal.  Thyroglobulin is low and her ATA antibodies are not elevated.   TSH is high >> will increase her levothyroxine from 88 to 112 mcg daily and recheck her test in 1.5 months.  Philemon Kingdom, MD PhD [facility] Endocrinology

## 2018-09-12 LAB — THYROGLOBULIN LEVEL: Thyroglobulin: 0.3 ng/mL — ABNORMAL LOW

## 2018-09-12 LAB — THYROGLOBULIN ANTIBODY: Thyroglobulin Ab: <1 IU/mL (ref <=1)

## 2018-09-12 MED ORDER — LEVOTHYROXINE SODIUM 112 MCG PO TABS: 112.0000 ug | ORAL_TABLET | Freq: Every day | ORAL | 3 refills | Status: DC

## 2018-10-25 ENCOUNTER — Other Ambulatory Visit (INDEPENDENT_AMBULATORY_CARE_PROVIDER_SITE_OTHER): Payer: BC Managed Care – PPO

## 2018-10-25 ENCOUNTER — Other Ambulatory Visit: Payer: Self-pay

## 2018-10-25 DIAGNOSIS — E890 Postprocedural hypothyroidism: Secondary | ICD-10-CM

## 2018-10-25 LAB — T4, FREE: Free T4: 0.97 ng/dL (ref 0.60–1.60)

## 2018-10-25 LAB — TSH: TSH: 7.49 u[IU]/mL — ABNORMAL HIGH (ref 0.35–4.50)

## 2018-10-27 ENCOUNTER — Other Ambulatory Visit: Payer: Self-pay | Admitting: Internal Medicine

## 2018-10-27 DIAGNOSIS — E890 Postprocedural hypothyroidism: Secondary | ICD-10-CM

## 2018-10-27 MED ORDER — LEVOTHYROXINE SODIUM 137 MCG PO TABS: 137.0000 ug | ORAL_TABLET | Freq: Every day | ORAL | 3 refills | Status: DC

## 2018-12-06 ENCOUNTER — Other Ambulatory Visit: Payer: Self-pay

## 2018-12-06 ENCOUNTER — Other Ambulatory Visit (INDEPENDENT_AMBULATORY_CARE_PROVIDER_SITE_OTHER): Payer: BC Managed Care – PPO

## 2018-12-06 DIAGNOSIS — E890 Postprocedural hypothyroidism: Secondary | ICD-10-CM | POA: Diagnosis not present

## 2018-12-06 LAB — T4, FREE: Free T4: 1.24 ng/dL (ref 0.60–1.60)

## 2018-12-06 LAB — TSH: TSH: 1.96 u[IU]/mL (ref 0.35–4.50)

## 2019-01-28 ENCOUNTER — Telehealth: Payer: Self-pay

## 2019-01-28 DIAGNOSIS — E890 Postprocedural hypothyroidism: Secondary | ICD-10-CM

## 2019-01-28 NOTE — Telephone Encounter (Signed)
Patient notified and labs ordered.  Lab appt made.

## 2019-01-28 NOTE — Telephone Encounter (Signed)
Patient called in stating she had a RFA. And now is experiencing some fatigue, stomach pain, nasuses. Patient doesn't know if this is happening because of her Thyroids levels being to low.    Please call and advise

## 2019-01-28 NOTE — Telephone Encounter (Signed)
I am not sure, but we can check her thyroid tests: TSH and free T4.  Can you please order these?

## 2019-01-30 ENCOUNTER — Other Ambulatory Visit (INDEPENDENT_AMBULATORY_CARE_PROVIDER_SITE_OTHER): Payer: BC Managed Care – PPO

## 2019-01-30 ENCOUNTER — Other Ambulatory Visit: Payer: Self-pay

## 2019-01-30 DIAGNOSIS — E890 Postprocedural hypothyroidism: Secondary | ICD-10-CM

## 2019-01-30 LAB — TSH: TSH: 1.43 u[IU]/mL (ref 0.35–4.50)

## 2019-01-30 LAB — T4, FREE: Free T4: 1.13 ng/dL (ref 0.60–1.60)

## 2019-03-01 ENCOUNTER — Other Ambulatory Visit: Payer: Self-pay | Admitting: Internal Medicine

## 2019-03-17 ENCOUNTER — Ambulatory Visit (INDEPENDENT_AMBULATORY_CARE_PROVIDER_SITE_OTHER): Payer: BC Managed Care – PPO | Admitting: Internal Medicine

## 2019-03-17 ENCOUNTER — Other Ambulatory Visit: Payer: Self-pay

## 2019-03-17 ENCOUNTER — Encounter: Payer: Self-pay | Admitting: Internal Medicine

## 2019-03-17 DIAGNOSIS — E890 Postprocedural hypothyroidism: Secondary | ICD-10-CM | POA: Diagnosis not present

## 2019-03-17 DIAGNOSIS — C73 Malignant neoplasm of thyroid gland: Secondary | ICD-10-CM

## 2019-03-17 NOTE — Patient Instructions (Signed)
Please Continue Levothyroxine 137 mcg daily.  Take the thyroid hormone every day, with water, at least 30 minutes before breakfast, separated by at least 4 hours from: - acid reflux medications - calcium - iron - multivitamins  Please come back for a follow-up appointment in 6 months.

## 2019-03-17 NOTE — Progress Notes (Signed)
Patient ID: Terri Klein, female   DOB: 15-May-1960, 59 y.o.   MRN: [id number]   Patient location: work My location: Office Persons participating in the virtual visit: patient, provider  I connected with the patient on 03/17/19 at 10:34 AM EST by a video enabled telemedicine application and verified that I am speaking with the correct person.   I discussed the limitations of evaluation and management by telemedicine and the availability of in person appointments. The patient expressed understanding and agreed to proceed.   Details of the encounter are shown below.  HPI  Terri Klein is a 59 y.o.-year-old female, initially referred by Dr. Harlow Klein, presenting for follow-up for thyroid cancer and postsurgical hypothyroidism. She moved to the area from [place] in 2015.  Last visit with me 6 months ago.  Reviewed and addended her thyroid cancer history: She was diagnosed with thyroid cancer after she found a neck mass in 03/2018 by looking in the mirror.  Retrospectively, she also had some dysphagia.  05/08/2018: Thyroid ultrasound showed several nodules of which 2 were biopsied  05/28/2018: FNA: Right inferior 1.5 cm; Othrer 2 dimensions: 1.4 x 1.1cm, Solid / almost completeley solid, Hypoechoic, TI-RADS total points 4  >> benign Isthmus mid 2.1cm; Other 2 dimensions: 1.7 x 1.2cm, Solid / almost completely solid, Hypoechoic, TI-RADS total points 4 >> ATYPIA OF UNDETERMINED SIGNIFICANCE OR FOLLICULAR LESION OF UNDETERMINED SIGNIFICANCE (BETHESDA CATEGORY III) >> Afirma: suspicious (malignancy risk 50%)  08/09/2018: Total thyroidectomy: Diagnosis Thyroid, thyroidectomy, Total - PAPILLARY THYROID CARCINOMA, FOLLICULAR VARIANT, 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 - CARCINOMA IS CONFINED TO THE THYROID. - FOLLICULAR ADENOMAS. - ONE BENIGN LYMPH NODE (0/1). - SEE ONCOLOGY TABLE BELOW. Microscopic Comment THYROID GLAND: Procedure: Thyroidectomy. Tumor Focality: Unifocal. Tumor Site: Isthmus. Tumor Size: 2.2 cm,  gross measurement. Histologic Type: Papillary thyroid carcinoma, follicular variant. Margins: Negative for carcinoma. Angioinvasion: Not identified. Lymphatic Invasion: Not identified. Extrathyroidal: Not identified. Regional Lymph Nodes: Number of Lymph Nodes Involved: 0 of 1. Pathologic Stage Classification (pTNM, AJCC 8th Edition): pT2, pN0.  She did not have RAI treatment.  At last visit, thyroglobulin was low but detectable and thyroglobulin antibodies were undetectable: Lab Results  Component Value Date   THYROGLB 0.3 (L) 09/11/2018   Lab Results  Component Value Date   THGAB <1 09/11/2018   Postsurgical hypothyroidism:  Pt is on levothyroxine 137 mcg daily, taken: - in am (at 4 am) - fasting - at least 30 min from b'fast - no Ca, Fe, PPIs - + MVI 4 hours later - not on Biotin  Reviewed her TFTs: Lab Results  Component Value Date   TSH 1.43 01/30/2019   TSH 1.96 12/06/2018   TSH 7.49 (H) 10/25/2018   TSH 12.96 (H) 09/11/2018   FREET4 1.13 01/30/2019   FREET4 1.24 12/06/2018   FREET4 0.97 10/25/2018   FREET4 0.74 09/11/2018    She has a history of 2 parathyroid glands resected >30 years ago for hyperparathyroidism causing her kidney stones.  Calcium normalized afterwards.  Postsurgical hypocalcemia: Of note, her calcium postop was low, at 7.9 but increased to 8.9 afterwards.  She stopped Tums at the indication of Dr. Harlow Klein in 08/27/2018.  At last visit calcium level was normal. Lab Results  Component Value Date   CALCIUM 9.2 09/11/2018   CALCIUM 7.8 (L) 08/10/2018   CALCIUM 9.4 08/02/2018   CALCIUM 9.3 12/23/2015   Vitamin D level was normal at last check: Lab Results  Component Value Date   VD25OH 41.70 09/11/2018   She  was on ergocalciferol 50,000 units weekly >> off since 2015.  Pt denies: - feeling nodules in neck - hoarseness - choking - SOB with lying down She had dysphagia for dry foods after the surgery, now resolved.  No FH of thyroid  cancer. No h/o radiation tx to head or neck.  No herbal supplements. No Biotin use. No recent steroids use.   She also has a h/o type 2 diabetes.  Latest HbA1c was reviewed: 08/02/2018: HbA1c 6.3%   ROS: Constitutional: no weight gain/no weight loss, no fatigue, no subjective hyperthermia, no subjective hypothermia Eyes: no blurry vision, no xerophthalmia ENT: no sore throat, + see HPI Cardiovascular: no CP/no SOB/no palpitations/no leg swelling Respiratory: no cough/no SOB/no wheezing Gastrointestinal: no N/no V/no D/no C/no acid reflux Musculoskeletal: no muscle aches/no joint aches Skin: no rashes, no hair loss Neurological: no tremors/no numbness/no tingling/no dizziness  I reviewed pt's medications, allergies, PMH, social hx, family hx, and changes were documented in the history of present illness. Otherwise, unchanged from my initial visit note.  Past Medical History:  Diagnosis Date  . Diabetes mellitus without complication ([place])   . Hyperlipidemia   . Hypertension   . Kidney stone    during her 20's   Past Surgical History:  Procedure Laterality Date  . THYROIDECTOMY N/A 08/09/2018   Procedure: TOTAL THYROIDECTOMY;  Surgeon: Armandina Gemma, MD;  Location: [facility];  Service: General;  Laterality: N/A;   Social History   Socioeconomic History  . Marital status: Single    Spouse name: Not on file  . Number of children: 2  . Years of education: Not on file  . Highest education level: Not on file  Occupational History  . Occupation: custodian  Scientific laboratory technician  . Financial resource strain: Not on file  . Food insecurity    Worry: Not on file    Inability: Not on file  . Transportation needs    Medical: Not on file    Non-medical: Not on file  Tobacco Use  . Smoking status: Never Smoker  . Smokeless tobacco: Never Used  Substance and Sexual Activity  . Alcohol use: Yes    Comment: socially, beer  . Drug use: No  . Sexual activity: Yes    Birth control/protection:  Condom   Current Outpatient Medications on File Prior to Visit  Medication Sig Dispense Refill  . amLODipine-benazepril (LOTREL) 10-40 MG capsule Take 1 capsule by mouth daily. 90 capsule 1  . aspirin EC 81 MG tablet Take 1 tablet (81 mg total) by mouth daily. 90 tablet 1  . atorvastatin (LIPITOR) 20 MG tablet Take 20 mg by mouth daily.    Marland Kitchen BLACK ELDERBERRY PO Take 1 tablet by mouth every other day.    Marland Kitchen glimepiride (AMARYL) 4 MG tablet Take 1 tablet (4 mg total) by mouth daily with breakfast. 90 tablet 1  . levothyroxine (SYNTHROID) 137 MCG tablet TAKE 1 TABLET (137 MCG TOTAL) BY MOUTH DAILY BEFORE BREAKFAST. 90 tablet 1  . metoprolol (LOPRESSOR) 50 MG tablet Take 1 tablet (50 mg total) by mouth daily. 90 tablet 1  . multivitamin-iron-minerals-folic acid (CENTRUM) chewable tablet Chew 1 tablet by mouth daily. 90 tablet 1  . sitaGLIPtin (JANUVIA) 100 MG tablet Take 1 tablet (100 mg total) by mouth daily. 90 tablet 1  . sitaGLIPtin-metformin (JANUMET) 50-1000 MG tablet Take 1 tablet by mouth 2 (two) times a day.     No current facility-administered medications on file prior to visit.   Allergies  Allergen Reactions  . Iodine     immobilized   Family History  Problem Relation Age of Onset  . Cancer Mother        Cervical  . Diabetes Mother   . Hypertension Mother   . Hyperlipidemia Brother   . Hypertension Maternal Grandmother   . Heart disease Maternal Grandmother   . Diabetes Maternal Grandmother     PE: LMP 04/20/2008  Wt Readings from Last 3 Encounters:  09/11/18 218 lb (98.9 kg)  08/09/18 218 lb 2 oz (98.9 kg)  08/02/18 218 lb 2 oz (98.9 kg)   Constitutional:  in NAD  The physical exam was not performed (virtual visit).  ASSESSMENT: 1. Thyroid cancer - see HPI  2. Postsurgical Hypothyroidism  3. Postsurgical hypocalcemia  PLAN:  1. 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  I again reassured the patient that papillary thyroid cancer is a slow-growing cancer with good prognosis. -Therefore, radioactive iodine ablation is not indicated  -We will continue to follow her by checking thyroglobulin levels and also prn neck ultrasounds. -We reviewed together her latest thyroglobulin and ATA antibodies.  The antibodies were negative and thyroglobulin was very low. -We will recheck these at next visit in 6 months.  At that time, we will check a new neck ultrasound.  -Of note, she denies any neck compression symptoms. -I will see her back in 6 months  2. Patient with h/o total thyroidectomy for cancer, now with iatrogenic hypothyroidism, on levothyroxine therapy.  - latest thyroid labs reviewed with pt >> normal: Lab Results  Component Value Date   TSH 1.43 01/30/2019   - she continues on LT4 137 mcg daily, dose increased since last visit - pt feels good on this dose, without complaints - we discussed about taking the thyroid hormone every day, with water, >30 minutes before breakfast, separated by >4 hours from acid reflux medications, calcium, iron, multivitamins. Pt. is taking it correctly. - will check thyroid tests at next visit  3. Postsurgical hypocalcemia -She is not on vitamin D supplements since 2016, previously on ergocalciferol -Reviewed calcium and vitamin D level from last visit, in 09/2018 and these were normal -We will continue without vitamin D supplementation but will check the level at next visit   Philemon Kingdom, MD PhD [facility] Endocrinology

## 2019-06-10 ENCOUNTER — Other Ambulatory Visit: Payer: Self-pay | Admitting: Internal Medicine

## 2019-09-19 ENCOUNTER — Other Ambulatory Visit: Payer: Self-pay

## 2019-09-19 ENCOUNTER — Ambulatory Visit: Payer: BC Managed Care – PPO | Admitting: Internal Medicine

## 2019-09-19 ENCOUNTER — Encounter: Payer: Self-pay | Admitting: Internal Medicine

## 2019-09-19 VITALS — BP 152/88 | HR 113 | Ht 67.0 in | Wt 207.5 lb

## 2019-09-19 DIAGNOSIS — C73 Malignant neoplasm of thyroid gland: Secondary | ICD-10-CM

## 2019-09-19 DIAGNOSIS — E890 Postprocedural hypothyroidism: Secondary | ICD-10-CM

## 2019-09-19 LAB — TSH: TSH: 1.83 u[IU]/mL (ref 0.35–4.50)

## 2019-09-19 LAB — T4, FREE: Free T4: 1.17 ng/dL (ref 0.60–1.60)

## 2019-09-19 NOTE — Progress Notes (Signed)
Patient ID: Terri Klein, female   DOB: 1960-11-29, 59 y.o.   MRN: [id number]   This visit occurred during the SARS-CoV-2 public health emergency.  Safety protocols were in place, including screening questions prior to the visit, additional usage of staff PPE, and extensive cleaning of exam room while observing appropriate contact time as indicated for disinfecting solutions.   HPI  Terri Klein is a 59 y.o.-year-old female, initially referred by Dr. Harlow Asa, presenting for follow-up for thyroid cancer and postsurgical hypothyroidism. She moved to the area from [place] in 2015.  Last visit with me 6 months ago.  She is here accompanied by her granddaughter.  She describes several new symptoms including a lump in her neck (lower chest), hot flushes, difficulty sleeping, fatigue, and weight loss/decreased appetite.  Thyroid cancer: Reviewed her thyroid cancer history: She was diagnosed with thyroid cancer after she found a neck mass in 03/2018 by looking in the mirror.  Retrospectively, she also had some dysphagia.  05/08/2018: Thyroid ultrasound showed several nodules of which 2 were biopsied  05/28/2018: FNA: Right inferior 1.5 cm; Othrer 2 dimensions: 1.4 x 1.1cm, Solid / almost completeley solid, Hypoechoic, TI-RADS total points 4  >> benign Isthmus mid 2.1cm; Other 2 dimensions: 1.7 x 1.2cm, Solid / almost completely solid, Hypoechoic, TI-RADS total points 4 >> ATYPIA OF UNDETERMINED SIGNIFICANCE OR FOLLICULAR LESION OF UNDETERMINED SIGNIFICANCE (BETHESDA CATEGORY III) >> Afirma: suspicious (malignancy risk 50%)  08/09/2018: Total thyroidectomy: Diagnosis Thyroid, thyroidectomy, Total - PAPILLARY THYROID CARCINOMA, FOLLICULAR VARIANT, 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 - CARCINOMA IS CONFINED TO THE THYROID. - FOLLICULAR ADENOMAS. - ONE BENIGN LYMPH NODE (0/1). - SEE ONCOLOGY TABLE BELOW. Microscopic Comment THYROID GLAND: Procedure: Thyroidectomy. Tumor Focality: Unifocal. Tumor Site: Isthmus. Tumor  Size: 2.2 cm, gross measurement. Histologic Type: Papillary thyroid carcinoma, follicular variant. Margins: Negative for carcinoma. Angioinvasion: Not identified. Lymphatic Invasion: Not identified. Extrathyroidal: Not identified. Regional Lymph Nodes: Number of Lymph Nodes Involved: 0 of 1. Pathologic Stage Classification (pTNM, AJCC 8th Edition): pT2, pN0.  She did not have RAI treatment.  Thyroglobulin was low but detectable and thyroglobulin antibodies undetectable: Lab Results  Component Value Date   THYROGLB 0.3 (L) 09/11/2018   Lab Results  Component Value Date   THGAB <1 09/11/2018   Postsurgical hypothyroidism:  Pt is on levothyroxine 137 mcg daily, taken: - in am (at 4 AM) - fasting - at least 30 min from b'fast - no Ca, Fe, PPIs - + MVIs 3.5-4 hours after levothyroxine - not on Biotin  Reviewed her TFTs: Lab Results  Component Value Date   TSH 1.43 01/30/2019   TSH 1.96 12/06/2018   TSH 7.49 (H) 10/25/2018   TSH 12.96 (H) 09/11/2018   FREET4 1.13 01/30/2019   FREET4 1.24 12/06/2018   FREET4 0.97 10/25/2018   FREET4 0.74 09/11/2018    She has a history of 2 parathyroid glands resected >30 years ago for hyperparathyroidism causing her kidney stones.  Calcium normalized afterwards.  Postsurgical hypocalcemia: Of note, her calcium postop was low, at 7.9 but normalized afterwards.  She stopped Tums at the indication of Dr. Harlow Asa in 08/27/2018.  Latest calcium was normal: Lab Results  Component Value Date   CALCIUM 9.2 09/11/2018   CALCIUM 7.8 (L) 08/10/2018   CALCIUM 9.4 08/02/2018   CALCIUM 9.3 12/23/2015   Latest vitamin D level was normal: Lab Results  Component Value Date   VD25OH 41.70 09/11/2018   She was on ergocalciferol 50,000 units weekly >> off since 2015.  Pt denies: -  feeling nodules in neck - hoarseness - choking - SOB with lying down She has a lump in lower chest >> needs sips of water to be able to swallow well.  No FH of  thyroid cancer. No h/o radiation tx to head or neck.  No herbal supplements. No Biotin use. No recent steroids use.   She also has a h/o type 2 diabetes.  Latest HbA1c: 08/02/2018: HbA1c 6.3%   ROS: Constitutional: no weight gain/+ weight loss, + decreased appetite; no weight loss, + occasional  fatigue, + hot flushes, no subjective hypothermia, + difficulty sleeping Eyes: no blurry vision, no xerophthalmia ENT: no sore throat, + see HPI Cardiovascular: no CP/no SOB/no palpitations/no leg swelling Respiratory: no cough/no SOB/no wheezing Gastrointestinal: no N/no V/no D/no C/no acid reflux Musculoskeletal: no muscle aches/no joint aches Skin: no rashes, no hair loss Neurological: no tremors/no numbness/no tingling/no dizziness  I reviewed pt's medications, allergies, PMH, social hx, family hx, and changes were documented in the history of present illness. Otherwise, unchanged from my initial visit note.  Past Medical History:  Diagnosis Date  . Diabetes mellitus without complication ([place])   . Hyperlipidemia   . Hypertension   . Kidney stone    during her 20's   Past Surgical History:  Procedure Laterality Date  . THYROIDECTOMY N/A 08/09/2018   Procedure: TOTAL THYROIDECTOMY;  Surgeon: Armandina Gemma, MD;  Location: [facility];  Service: General;  Laterality: N/A;   Social History   Socioeconomic History  . Marital status: Single    Spouse name: Not on file  . Number of children: 2  . Years of education: Not on file  . Highest education level: Not on file  Occupational History  . Occupation: custodian  Scientific laboratory technician  . Financial resource strain: Not on file  . Food insecurity    Worry: Not on file    Inability: Not on file  . Transportation needs    Medical: Not on file    Non-medical: Not on file  Tobacco Use  . Smoking status: Never Smoker  . Smokeless tobacco: Never Used  Substance and Sexual Activity  . Alcohol use: Yes    Comment: socially, beer  . Drug use: No  .  Sexual activity: Yes    Birth control/protection: Condom   Current Outpatient Medications on File Prior to Visit  Medication Sig Dispense Refill  . amLODipine-benazepril (LOTREL) 10-40 MG capsule Take 1 capsule by mouth daily. 90 capsule 1  . aspirin EC 81 MG tablet Take 1 tablet (81 mg total) by mouth daily. 90 tablet 1  . atorvastatin (LIPITOR) 20 MG tablet Take 20 mg by mouth daily.    Marland Kitchen BLACK ELDERBERRY PO Take 1 tablet by mouth every other day.    Marland Kitchen glimepiride (AMARYL) 4 MG tablet Take 1 tablet (4 mg total) by mouth daily with breakfast. 90 tablet 1  . levothyroxine (SYNTHROID) 137 MCG tablet TAKE 1 TABLET (137 MCG TOTAL) BY MOUTH DAILY BEFORE BREAKFAST. 90 tablet 1  . metoprolol (LOPRESSOR) 50 MG tablet Take 1 tablet (50 mg total) by mouth daily. 90 tablet 1  . multivitamin-iron-minerals-folic acid (CENTRUM) chewable tablet Chew 1 tablet by mouth daily. 90 tablet 1  . sitaGLIPtin (JANUVIA) 100 MG tablet Take 1 tablet (100 mg total) by mouth daily. 90 tablet 1  . sitaGLIPtin-metformin (JANUMET) 50-1000 MG tablet Take 1 tablet by mouth 2 (two) times a day.     No current facility-administered medications on file prior to visit.  Allergies  Allergen Reactions  . Iodine     immobilized   Family History  Problem Relation Age of Onset  . Cancer Mother        Cervical  . Diabetes Mother   . Hypertension Mother   . Hyperlipidemia Brother   . Hypertension Maternal Grandmother   . Heart disease Maternal Grandmother   . Diabetes Maternal Grandmother     PE: BP (!) 152/88 (BP Location: Left Arm, Patient Position: Sitting, Cuff Size: Small)   Pulse (!) 113   Ht '5\' 7"'$  (1.702 m)   Wt 207 lb 8 oz (94.1 kg)   LMP 04/20/2008   SpO2 98%   BMI 32.50 kg/m  Wt Readings from Last 3 Encounters:  09/19/19 207 lb 8 oz (94.1 kg)  09/11/18 218 lb (98.9 kg)  08/09/18 218 lb 2 oz (98.9 kg)   Constitutional: overweight, in NAD Eyes: PERRLA, EOMI, no exophthalmos ENT: moist mucous  membranes, no neck masses palpated, no cervical lymphadenopathy Cardiovascular: tachycardia, RR, No MRG Respiratory: CTA B Gastrointestinal: abdomen soft, NT, ND, BS+ Musculoskeletal: no deformities, strength intact in all 4 Skin: moist, warm, no rashes Neurological: no tremor with outstretched hands, DTR normal in all 4  ASSESSMENT: 1. Thyroid cancer - see HPI  2. Postsurgical Hypothyroidism  3. Postsurgical hypocalcemia  PLAN:  1. 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  PTC is a slow-growing cancer with good prognosis.  Therefore, RAI treatment is not indicated -We will continue to follow her by checking thyroglobulin levels and also as needed neck ultrasound -Latest thyroglobulin was very low and ATA antibodies were negative -We will recheck her thyroglobulin and ATA now and also check a neck ultrasound now -At this visit, she does describe neck compression symptoms - lump in neck.  However, she points towards the upper chest  -I will see her back in 6 mo  2. Patient with h/o total thyroidectomy for cancer, now with iatrogenic hypothyroidism, on levothyroxine - latest thyroid labs reviewed with pt >> normal: Lab Results  Component Value Date   TSH 1.43 01/30/2019   - she continues on LT4 137 mcg daily - pt feels good on this dose but she does have a series of new symptoms which may be related to much thyroid hormone: Hot flashes, insomnia, weight loss.  However, these are nonspecific, we discussed that if her TFTs are normal, the symptoms are likely not related the thyroid - we discussed about taking the thyroid hormone every day, with water, >30 minutes before breakfast, separated by >4 hours from acid reflux medications, calcium, iron, multivitamins. Pt. is taking it correctly. -  will check thyroid tests today: TSH and fT4 - If labs are abnormal, she will need to return for repeat TFTs in 1.5 months  3. Postsurgical hypocalcemia -Previously on ergocalciferol -Vitamin D levels were normal so I did not restart her vitamin D supplementation at last visit -Recheck vitamin D level today  Component     Latest Ref Rng & Units 09/19/2019  Thyroglobulin     ng/mL <0.1 (L)  Comment        TSH     0.35 - 4.50 uIU/mL 1.83  T4,Free(Direct)     0.60 - 1.60 ng/dL 1.17  Thyroglobulin Ab     < or = 1 IU/mL <  1  Vitamin D, 25-Hydroxy     30.0 - 100.0 ng/mL 53.9  TFTs normal.  Thyroglobulin undetectable now, which is excellent.  Vitamin D normal.  Neck U/S pending.  Philemon Kingdom, MD PhD [facility] Endocrinology

## 2019-09-19 NOTE — Patient Instructions (Addendum)
Please continue Levothyroxine 137 mcg daily.  Take the thyroid hormone every day, with water, at least 30 minutes before breakfast, separated by at least 4 hours from: - acid reflux medications - calcium - iron - multivitamins  Please stop at the lab.  We will schedule a neck U/S.  Please come back for a follow-up appointment in 6 months.

## 2019-09-20 LAB — VITAMIN D 25 HYDROXY (VIT D DEFICIENCY, FRACTURES): Vit D, 25-Hydroxy: 53.9 ng/mL (ref 30.0–100.0)

## 2019-09-22 LAB — THYROGLOBULIN LEVEL: Thyroglobulin: <0.1 ng/mL — ABNORMAL LOW

## 2019-09-22 LAB — THYROGLOBULIN ANTIBODY: Thyroglobulin Ab: <1 IU/mL (ref <=1)

## 2019-10-06 ENCOUNTER — Ambulatory Visit
Admission: RE | Admit: 2019-10-06 | Discharge: 2019-10-06 | Disposition: A | Discharge status: Home / Self Care | Payer: BC Managed Care – PPO | Source: Ambulatory Visit | Attending: Internal Medicine | Admitting: Internal Medicine

## 2019-10-06 DIAGNOSIS — C73 Malignant neoplasm of thyroid gland: Secondary | ICD-10-CM

## 2020-01-02 ENCOUNTER — Encounter: Payer: Self-pay | Admitting: Internal Medicine

## 2020-01-02 ENCOUNTER — Other Ambulatory Visit: Payer: Self-pay

## 2020-01-02 ENCOUNTER — Ambulatory Visit: Payer: BC Managed Care – PPO | Admitting: Internal Medicine

## 2020-01-02 VITALS — BP 130/82 | HR 80 | Ht 67.0 in | Wt 212.8 lb

## 2020-01-02 DIAGNOSIS — C73 Malignant neoplasm of thyroid gland: Secondary | ICD-10-CM | POA: Diagnosis not present

## 2020-01-02 DIAGNOSIS — E890 Postprocedural hypothyroidism: Secondary | ICD-10-CM

## 2020-01-02 MED ORDER — LEVOTHYROXINE SODIUM 137 MCG PO TABS: 137.0000 ug | ORAL_TABLET | Freq: Every day | ORAL | 3 refills | Status: DC

## 2020-01-02 NOTE — Patient Instructions (Addendum)
Please continue Levothyroxine 137 mcg daily.  Take the thyroid hormone every day, with water, at least 30 minutes before breakfast, separated by at least 4 hours from: - acid reflux medications - calcium - iron - multivitamins  Please come back for a follow-up appointment in 9 months.

## 2020-01-02 NOTE — Progress Notes (Signed)
Patient ID: Terri Klein, female   DOB: 1960/06/30, 59 y.o.   MRN: [id number]   This visit occurred during the SARS-CoV-2 public health emergency.  Safety protocols were in place, including screening questions prior to the visit, additional usage of staff PPE, and extensive cleaning of exam room while observing appropriate contact time as indicated for disinfecting solutions.   HPI  Terri Klein is a 59 y.o.-year-old female, initially referred by Dr. Harlow Asa, presenting for follow-up for thyroid cancer and postsurgical hypothyroidism. She moved to the area from [place] in 2015.  Last visit with me 3 months ago.  At last visit, she described several symptoms including a lump in her neck (lower chest), hot flashes, difficulty sleeping, fatigue, and weight loss/decreased appetite.  These have resolved.  Thyroid cancer: Reviewed and addended her thyroid cancer history: She was diagnosed with thyroid cancer after she found a neck mass in 03/2018 by looking in the mirror.  Retrospectively, she also had some dysphagia.  05/08/2018: Thyroid ultrasound showed several nodules of which 2 were biopsied  05/28/2018: FNA: Right inferior 1.5 cm; Othrer 2 dimensions: 1.4 x 1.1cm, Solid / almost completeley solid, Hypoechoic, TI-RADS total points 4  >> benign Isthmus mid 2.1cm; Other 2 dimensions: 1.7 x 1.2cm, Solid / almost completely solid, Hypoechoic, TI-RADS total points 4 >> ATYPIA OF UNDETERMINED SIGNIFICANCE OR FOLLICULAR LESION OF UNDETERMINED SIGNIFICANCE (BETHESDA CATEGORY III) >> Afirma: suspicious (malignancy risk 50%)  08/09/2018: Total thyroidectomy: Diagnosis Thyroid, thyroidectomy, Total - PAPILLARY THYROID CARCINOMA, FOLLICULAR VARIANT, 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 - CARCINOMA IS CONFINED TO THE THYROID. - FOLLICULAR ADENOMAS. - ONE BENIGN LYMPH NODE (0/1). - SEE ONCOLOGY TABLE BELOW. Microscopic Comment THYROID GLAND: Procedure: Thyroidectomy. Tumor Focality: Unifocal. Tumor Site: Isthmus. Tumor  Size: 2.2 cm, gross measurement. Histologic Type: Papillary thyroid carcinoma, follicular variant. Margins: Negative for carcinoma. Angioinvasion: Not identified. Lymphatic Invasion: Not identified. Extrathyroidal: Not identified. Regional Lymph Nodes: Number of Lymph Nodes Involved: 0 of 1. Pathologic Stage Classification (pTNM, AJCC 8th Edition): pT2, pN0.  She did not have RAI treatment.  Neck U/S (10/07/2019):  Status post total thyroidectomy. No thyroid bed abnormality by Ultrasound. No regional adenopathy.  Review latest thyroglobulin and ATA antibody levels: Lab Results  Component Value Date   THYROGLB <0.1 (L) 09/19/2019   THYROGLB 0.3 (L) 09/11/2018   Lab Results  Component Value Date   THGAB <1 09/19/2019   THGAB <1 09/11/2018   Postsurgical hypothyroidism:  Pt is on levothyroxine 137 mcg daily, taken: - in am - fasting - at least 30 min from b'fast - no Ca, Fe, PPIs, + MVI's 3.5 to 4 hours after levothyroxine - not on Biotin  Reviewed her TFTs: Lab Results  Component Value Date   TSH 1.83 09/19/2019   TSH 1.43 01/30/2019   TSH 1.96 12/06/2018   TSH 7.49 (H) 10/25/2018   TSH 12.96 (H) 09/11/2018   FREET4 1.17 09/19/2019   FREET4 1.13 01/30/2019   FREET4 1.24 12/06/2018   FREET4 0.97 10/25/2018   FREET4 0.74 09/11/2018    She has a history of 2 parathyroid glands resected >30 years ago for hyperparathyroidism causing her kidney stones.  Calcium normalized afterwards..  Postsurgical hypocalcemia: Of note, her calcium postop was low, at 7.9 but normalized afterwards.  She stopped Tums at the indication of Dr. Harlow Asa in 08/27/2018.  Latest calcium level was normal: Lab Results  Component Value Date   CALCIUM 9.2 09/11/2018   CALCIUM 7.8 (L) 08/10/2018   CALCIUM 9.4 08/02/2018   CALCIUM 9.3  12/23/2015   Latest vitamin D level was normal: Lab Results  Component Value Date   VD25OH 53.9 09/19/2019   VD25OH 41.70 09/11/2018   She was on  ergocalciferol 50,000 units weekly >> off since 2015.  Pt denies: - feeling nodules in neck - hoarseness - dysphagia - choking - SOB with lying down  No FH of thyroid cancer. No h/o radiation tx to head or neck.  No herbal supplements. No Biotin use. No recent steroids use.   She also has a h/o type 2 diabetes.  Latest HbA1c: 08/02/2018: HbA1c 6.3%   ROS: Constitutional: no weight gain/no weight loss, no fatigue, no subjective hyperthermia, no subjective hypothermia, + hot flashes, + difficulty sleeping Eyes: no blurry vision, no xerophthalmia ENT: no sore throat, + see HPI Cardiovascular: no CP/no SOB/no palpitations/no leg swelling Respiratory: no cough/no SOB/no wheezing Gastrointestinal: no N/no V/no D/no C/no acid reflux Musculoskeletal: no muscle aches/no joint aches Skin: no rashes, no hair loss Neurological: no tremors/no numbness/no tingling/no dizziness  I reviewed pt's medications, allergies, PMH, social hx, family hx, and changes were documented in the history of present illness. Otherwise, unchanged from my initial visit note.  Past Medical History:  Diagnosis Date  . Diabetes mellitus without complication ([place])   . Hyperlipidemia   . Hypertension   . Kidney stone    during her 20's   Past Surgical History:  Procedure Laterality Date  . THYROIDECTOMY N/A 08/09/2018   Procedure: TOTAL THYROIDECTOMY;  Surgeon: Armandina Gemma, MD;  Location: [facility];  Service: General;  Laterality: N/A;   Social History   Socioeconomic History  . Marital status: Single    Spouse name: Not on file  . Number of children: 2  . Years of education: Not on file  . Highest education level: Not on file  Occupational History  . Occupation: custodian  Scientific laboratory technician  . Financial resource strain: Not on file  . Food insecurity    Worry: Not on file    Inability: Not on file  . Transportation needs    Medical: Not on file    Non-medical: Not on file  Tobacco Use  . Smoking status:  Never Smoker  . Smokeless tobacco: Never Used  Substance and Sexual Activity  . Alcohol use: Yes    Comment: socially, beer  . Drug use: No  . Sexual activity: Yes    Birth control/protection: Condom   Current Outpatient Medications on File Prior to Visit  Medication Sig Dispense Refill  . amLODipine-benazepril (LOTREL) 10-40 MG capsule Take 1 capsule by mouth daily. 90 capsule 1  . aspirin EC 81 MG tablet Take 1 tablet (81 mg total) by mouth daily. 90 tablet 1  . atorvastatin (LIPITOR) 20 MG tablet Take 20 mg by mouth daily.    Marland Kitchen BLACK ELDERBERRY PO Take 1 tablet by mouth every other day.    Marland Kitchen glimepiride (AMARYL) 4 MG tablet Take 1 tablet (4 mg total) by mouth daily with breakfast. 90 tablet 1  . levothyroxine (SYNTHROID) 137 MCG tablet TAKE 1 TABLET (137 MCG TOTAL) BY MOUTH DAILY BEFORE BREAKFAST. 90 tablet 1  . metoprolol (LOPRESSOR) 50 MG tablet Take 1 tablet (50 mg total) by mouth daily. 90 tablet 1  . multivitamin-iron-minerals-folic acid (CENTRUM) chewable tablet Chew 1 tablet by mouth daily. 90 tablet 1  . sitaGLIPtin-metformin (JANUMET) 50-1000 MG tablet Take 1 tablet by mouth 2 (two) times a day.     No current facility-administered medications on file prior to visit.  Allergies  Allergen Reactions  . Iodine     immobilized   Family History  Problem Relation Age of Onset  . Cancer Mother        Cervical  . Diabetes Mother   . Hypertension Mother   . Hyperlipidemia Brother   . Hypertension Maternal Grandmother   . Heart disease Maternal Grandmother   . Diabetes Maternal Grandmother    PE: BP 130/82 (BP Location: Left Arm, Patient Position: Sitting, Cuff Size: Normal)   Pulse 80   Ht _0  (1.702 m)   Wt 212 lb 12.8 oz (96.5 kg)   LMP 04/20/2008   SpO2 97%   BMI 33.33 kg/m  Wt Readings from Last 3 Encounters:  01/02/20 212 lb 12.8 oz (96.5 kg)  09/19/19 207 lb 8 oz (94.1 kg)  09/11/18 218 lb (98.9 kg)   Constitutional: overweight, in NAD Eyes: PERRLA,  EOMI, no exophthalmos ENT: moist mucous membranes, no neck masses palpable, no cervical lymphadenopathy Cardiovascular: RRR, No MRG Respiratory: CTA B Gastrointestinal: abdomen soft, NT, ND, BS+ Musculoskeletal: no deformities, strength intact in all 4 Skin: moist, warm, no rashes Neurological: no tremor with outstretched hands, DTR normal in all 4  ASSESSMENT: 1. Thyroid cancer - see HPI  2. Postsurgical Hypothyroidism  3. Postsurgical hypocalcemia  PLAN:  1. 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  PTC is a slow-growing cancer with good prognosis.  Therefore, RAI treatment is not indicated -We will continue to follow her by checking thyroglobulin levels and also prn neck ultrasound -At last visit, she complained of problems swallowing and a lump in her lower neck/chest.  We reviewed together the results of her neck ultrasound obtained after last visit and this did not show any suspicious masses. -At last visit we checked her ATA and thyroglobulin and they were both undetectable, which is excellent -I will see her back in 9 months  2. Patient with h/o total thyroidectomy for cancer, now with iatrogenic hypothyroidism, on levothyroxine - latest thyroid labs reviewed with pt >> normal: Lab Results  Component Value Date   TSH 1.83 09/19/2019   - she continues on LT4 137 mcg daily - pt feels good on this dose. At last visit, she had several symptoms that could have been attributed to over replacement with thyroid hormone, however, her TFTs were normal. Her symptoms resolved at this visit. - we discussed about taking the thyroid hormone every day, with water, >30 minutes before breakfast, separated by >4 hours from acid reflux medications, calcium, iron, multivitamins. Pt. is taking it  correctly.  3. Postsurgical hypocalcemia -Previously on ergocalciferol -However, she was off at last check 3 months ago, and, since her vitamin D level was normal we did not restart vitamin D -Recheck vitamin D level at next visit  Philemon Kingdom, MD PhD [facility] Endocrinology

## 2020-03-25 ENCOUNTER — Ambulatory Visit: Payer: BC Managed Care – PPO | Admitting: Internal Medicine

## 2020-05-17 ENCOUNTER — Encounter (HOSPITAL_COMMUNITY): Payer: Self-pay

## 2020-05-17 ENCOUNTER — Emergency Department (HOSPITAL_COMMUNITY)
Admission: EM | Admit: 2020-05-17 | Discharge: 2020-05-18 | Disposition: A | Discharge status: Home / Self Care | Payer: BC Managed Care – PPO | Attending: Emergency Medicine | Admitting: Emergency Medicine

## 2020-05-17 DIAGNOSIS — R1032 Left lower quadrant pain: Secondary | ICD-10-CM | POA: Diagnosis not present

## 2020-05-17 DIAGNOSIS — E860 Dehydration: Secondary | ICD-10-CM

## 2020-05-17 DIAGNOSIS — Z7984 Long term (current) use of oral hypoglycemic drugs: Secondary | ICD-10-CM | POA: Insufficient documentation

## 2020-05-17 DIAGNOSIS — I10 Essential (primary) hypertension: Secondary | ICD-10-CM | POA: Insufficient documentation

## 2020-05-17 DIAGNOSIS — Z85850 Personal history of malignant neoplasm of thyroid: Secondary | ICD-10-CM | POA: Diagnosis not present

## 2020-05-17 DIAGNOSIS — R112 Nausea with vomiting, unspecified: Secondary | ICD-10-CM | POA: Diagnosis present

## 2020-05-17 DIAGNOSIS — E119 Type 2 diabetes mellitus without complications: Secondary | ICD-10-CM | POA: Insufficient documentation

## 2020-05-17 DIAGNOSIS — Z7982 Long term (current) use of aspirin: Secondary | ICD-10-CM | POA: Diagnosis not present

## 2020-05-17 DIAGNOSIS — E876 Hypokalemia: Secondary | ICD-10-CM | POA: Diagnosis not present

## 2020-05-17 DIAGNOSIS — R000 Tachycardia, unspecified: Secondary | ICD-10-CM | POA: Insufficient documentation

## 2020-05-17 DIAGNOSIS — Z79899 Other long term (current) drug therapy: Secondary | ICD-10-CM | POA: Diagnosis not present

## 2020-05-17 LAB — TROPONIN I (HIGH SENSITIVITY)
Troponin I (High Sensitivity): 5 ng/L (ref <18)
Troponin I (High Sensitivity): 6 ng/L (ref <18)

## 2020-05-17 LAB — COMPREHENSIVE METABOLIC PANEL
ALT: 20 U/L (ref 0–44)
AST: 22 U/L (ref 15–41)
Albumin: 4.6 g/dL (ref 3.5–5.0)
Alkaline Phosphatase: 53 U/L (ref 38–126)
Anion gap: 18 — ABNORMAL HIGH (ref 5–15)
BUN: 12 mg/dL (ref 8–23)
CO2: 21 mmol/L — ABNORMAL LOW (ref 22–32)
Calcium: 9.5 mg/dL (ref 8.9–10.3)
Chloride: 99 mmol/L (ref 98–111)
Creatinine, Ser: 0.82 mg/dL (ref 0.44–1.00)
GFR, Estimated: >60 mL/min (ref >60)
Glucose, Bld: 237 mg/dL — ABNORMAL HIGH (ref 70–99)
Potassium: 2.9 mmol/L — ABNORMAL LOW (ref 3.5–5.1)
Sodium: 138 mmol/L (ref 135–145)
Total Bilirubin: 0.9 mg/dL (ref 0.3–1.2)
Total Protein: 7.8 g/dL (ref 6.5–8.1)

## 2020-05-17 LAB — CBC
HCT: 41.2 % (ref 36.0–46.0)
Hemoglobin: 13.7 g/dL (ref 12.0–15.0)
MCH: 26.8 pg (ref 26.0–34.0)
MCHC: 33.3 g/dL (ref 30.0–36.0)
MCV: 80.6 fL (ref 80.0–100.0)
Platelets: 317 10*3/uL (ref 150–400)
RBC: 5.11 MIL/uL (ref 3.87–5.11)
RDW: 14.5 % (ref 11.5–15.5)
WBC: 14.4 10*3/uL — ABNORMAL HIGH (ref 4.0–10.5)
nRBC: 0 % (ref 0.0–0.2)

## 2020-05-17 LAB — LIPASE, BLOOD: Lipase: 25 U/L (ref 11–51)

## 2020-05-17 LAB — CBG MONITORING, ED: Glucose-Capillary: 225 mg/dL — ABNORMAL HIGH (ref 70–99)

## 2020-05-17 MED ORDER — POTASSIUM CHLORIDE 10 MEQ/100ML IV SOLN
10.0000 meq | INTRAVENOUS | Status: AC
  Administered 2020-05-18 (×2): 10 meq via INTRAVENOUS
  Filled 2020-05-17 (×2): qty 100

## 2020-05-17 MED ORDER — SODIUM CHLORIDE 0.9 % IV BOLUS
1000.0000 mL | Freq: Once | INTRAVENOUS | Status: AC
  Administered 2020-05-18: 1000 mL via INTRAVENOUS

## 2020-05-17 MED ORDER — ONDANSETRON 4 MG PO TBDP
4.0000 mg | ORAL_TABLET | Freq: Once | ORAL | Status: AC
  Administered 2020-05-17: 4 mg via ORAL
  Filled 2020-05-17: qty 1

## 2020-05-17 MED ORDER — MORPHINE SULFATE (PF) 4 MG/ML IV SOLN
4.0000 mg | Freq: Once | INTRAVENOUS | Status: AC
  Administered 2020-05-18: 4 mg via INTRAVENOUS
  Filled 2020-05-17: qty 1

## 2020-05-17 MED ORDER — METOCLOPRAMIDE HCL 5 MG/ML IJ SOLN
10.0000 mg | Freq: Once | INTRAMUSCULAR | Status: AC
  Administered 2020-05-18: 10 mg via INTRAVENOUS
  Filled 2020-05-17: qty 2

## 2020-05-17 NOTE — ED Notes (Addendum)
Pt now c/o chest pain and SOB in lobby once exiting triage. Daughters informed that triage nurse just examined pt and daughters asking if somewhere for patient to lay down. Explained that there is nowhere to lay down in lobby, daughters demanding to speak with someone else. Pt taken to triage where triage RN was informed and EKG was completed and oxygen levels were checked. O2 levels were 100. Pt crying and thrashing around in wheelchair in lobby.

## 2020-05-17 NOTE — ED Notes (Signed)
Pt now c/o of CP.

## 2020-05-17 NOTE — ED Triage Notes (Signed)
Pt reports that she has been having LLQ abd pain all day with vomiting and diarrhea, denies fevers. Pt is diabetic and reports CBGs have been high

## 2020-05-17 NOTE — ED Notes (Signed)
Pt placed on 2L [place] at this time per PA, Mia.

## 2020-05-18 ENCOUNTER — Emergency Department (HOSPITAL_COMMUNITY): Payer: BC Managed Care – PPO

## 2020-05-18 LAB — URINALYSIS, ROUTINE W REFLEX MICROSCOPIC
Bacteria, UA: NONE SEEN
Bilirubin Urine: NEGATIVE
Glucose, UA: 150 mg/dL — AB
Hgb urine dipstick: NEGATIVE
Ketones, ur: 20 mg/dL — AB
Leukocytes,Ua: NEGATIVE
Nitrite: NEGATIVE
Protein, ur: 100 mg/dL — AB
Specific Gravity, Urine: >1.046 — ABNORMAL HIGH (ref 1.005–1.030)
pH: 5 (ref 5.0–8.0)

## 2020-05-18 LAB — LACTIC ACID, PLASMA
Lactic Acid, Venous: 2.1 mmol/L (ref 0.5–1.9)
Lactic Acid, Venous: 1.3 mmol/L (ref 0.5–1.9)

## 2020-05-18 LAB — MAGNESIUM: Magnesium: 1.6 mg/dL — ABNORMAL LOW (ref 1.7–2.4)

## 2020-05-18 MED ORDER — IOHEXOL 300 MG/ML  SOLN
100.0000 mL | Freq: Once | INTRAMUSCULAR | Status: AC | PRN
  Administered 2020-05-18: 100 mL via INTRAVENOUS

## 2020-05-18 MED ORDER — POTASSIUM CHLORIDE CRYS ER 20 MEQ PO TBCR: 20.0000 meq | EXTENDED_RELEASE_TABLET | Freq: Two times a day (BID) | ORAL | 0 refills | Status: DC

## 2020-05-18 MED ORDER — MORPHINE SULFATE (PF) 4 MG/ML IV SOLN
4.0000 mg | Freq: Once | INTRAVENOUS | Status: AC
  Administered 2020-05-18: 4 mg via INTRAVENOUS
  Filled 2020-05-18: qty 1

## 2020-05-18 MED ORDER — MAGNESIUM SULFATE 2 GM/50ML IV SOLN
2.0000 g | Freq: Once | INTRAVENOUS | Status: AC
  Administered 2020-05-18: 2 g via INTRAVENOUS
  Filled 2020-05-18: qty 50

## 2020-05-18 MED ORDER — ONDANSETRON 4 MG PO TBDP: 4.0000 mg | ORAL_TABLET | Freq: Three times a day (TID) | ORAL | 0 refills | Status: DC | PRN

## 2020-05-18 NOTE — ED Notes (Signed)
PT transported to [facility].

## 2020-05-18 NOTE — Discharge Instructions (Signed)
Thank you for allowing me to care for you today in the Emergency Department.   You were seen today for nausea, vomiting, diarrhea, and abdominal pain.  Your work-up showed that you are very dehydrated and your potassium and magnesium levels were low due to vomiting.  These were replenished in the emergency department, but you should continue to take potassium chloride 2 times daily for the next 5 days.  Let 1 tablet of Zofran dissolve under your tongue every 8 hours as needed for nausea or vomiting.  Make sure that you are drinking plenty of fluids by mouth over the next few days.  Take 650 mg of Tylenol or 600 mg of ibuprofen with food every 6 hours for pain.  You can alternate between these 2 medications every 3 hours if your pain returns.  For instance, you can take Tylenol at noon, followed by a dose of ibuprofen at 3, followed by second dose of Tylenol and 6.  Please call to schedule follow-up appointment with your primary care provider for a recheck since you were seen in the emergency department today.   Return to the emergency department if you develop uncontrollable vomiting despite taking Zofran, if you stop making urine, if you pass out, develop respiratory distress, start having temperatures of greater than 100.4 with severe abdominal pain, or other new, concerning symptoms.

## 2020-05-18 NOTE — ED Provider Notes (Signed)
[facility] EMERGENCY DEPARTMENT Provider Note   CSN: [id number] Arrival date & time: 05/17/20  2002     History Chief Complaint  Patient presents with  . Abdominal Pain    Terri Klein is a 60 y.o. 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  Family provides the history.  The patient was endorsing pain in her left lower quadrant that began this morning.  The patient was initially able to go to work, but returned home by 9:30 due to pain.  She is unable to characterize the pain, but rates it as 10 out of 10.  The pain radiates around to her left lower back.  Pain has progressively worsened throughout the day.  She also developed nausea and countless episodes of vomiting and diarrhea.  She is also endorsing a sore throat, but denies hematemesis, and chest pain, which she is unable to characterize at this time.  No fevers, chills, dysuria, hematuria, vaginal bleeding, discharge, flank pain.  She presented to urgent care earlier today and had a urinalysis.  She was advised that due to her symptoms that she would need to present to the urgent care for further evaluation.  Family notes that the patient was seen in the ER in [place] in September 2021 for similar symptoms.  They are unsure of what she was diagnosed with, but states that the facility checked a D-dimer, which was high and that she was told that she may have a dissolving blood clot?  She was advised to follow-up with primary care.  They do not recall that she was started on antibiotics.  The patient is laying quietly in the left lateral decubitus position and appears uncomfortable.  She has been unable to tolerate any food or fluids since onset of her symptoms  this morning.  She has a history of uterine fibroids, kidney stones.  No history of ovarian cyst.  No history of bowel obstructions or diverticulitis.   Additional information was later provided by the patient.  She reports abdominal pain began first followed by vomiting and diarrhea.  She has had no dysuria, hematuria, urinary frequency or hesitancy.  No vaginal bleeding or discharge.  Pain does not feel similar to previous kidney stones.  No suspicious food intake.  No recent travel.  No known sick contacts.  The history is provided by the patient and medical records. No language interpreter was used.       Past Medical History:  Diagnosis Date  . Diabetes mellitus without complication ([place])   . Hyperlipidemia   . Hypertension   . Kidney stone    during her 20's    Patient Active Problem List   Diagnosis Date Noted  . 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 Papillary thyroid carcinoma ([place]) - Follicular variant 46/50/3546  . Essential hypertension 12/24/2015  . Type 2 diabetes mellitus ([place]) 12/24/2015  . Healthcare maintenance 12/24/2015  . Hyperlipidemia associated with type 2 diabetes mellitus ([place]) 12/24/2015    Past Surgical History:  Procedure Laterality Date  . THYROIDECTOMY N/A 08/09/2018   Procedure: TOTAL THYROIDECTOMY;  Surgeon: Armandina Gemma, MD;  Location: [facility];  Service: General;  Laterality: N/A;     OB History   No obstetric history on file.     Family History  Problem Relation  Age of Onset  . Cancer Mother        Cervical  . Diabetes Mother   . Hypertension Mother   . Hyperlipidemia Brother   . Hypertension Maternal Grandmother   . Heart disease Maternal Grandmother   . Diabetes Maternal Grandmother     Social History   Tobacco Use  . Smoking status: Never Smoker  . Smokeless tobacco: Never Used  Substance Use Topics  . Alcohol use: Yes    Comment: socially  . Drug use: No    Home Medications Prior to Admission medications    Medication Sig Start Date End Date Taking? Authorizing Provider  amLODipine-benazepril (LOTREL) 10-40 MG capsule Take 1 capsule by mouth daily. 12/24/15  Yes Mignon Pine, DO  aspirin EC 81 MG tablet Take 1 tablet (81 mg total) by mouth daily. 12/24/15  Yes Mignon Pine, DO  atorvastatin (LIPITOR) 20 MG tablet Take 20 mg by mouth daily.   Yes [provider]  BLACK ELDERBERRY PO Take 1 tablet by mouth every other day.   Yes [provider]  glimepiride (AMARYL) 4 MG tablet Take 1 tablet (4 mg total) by mouth daily with breakfast. 12/24/15  Yes Mignon Pine, DO  levothyroxine (SYNTHROID) 137 MCG tablet Take 1 tablet (137 mcg total) by mouth daily before breakfast. 01/02/20  Yes Philemon Kingdom, MD  metoprolol (LOPRESSOR) 50 MG tablet Take 1 tablet (50 mg total) by mouth daily. 12/24/15 09/19/19 Yes Mignon Pine, DO  multivitamin-iron-minerals-folic acid (CENTRUM) chewable tablet Chew 1 tablet by mouth daily. 12/24/15  Yes Mignon Pine, DO  ondansetron (ZOFRAN ODT) 4 MG disintegrating tablet Take 1 tablet (4 mg total) by mouth every 8 (eight) hours as needed. 05/18/20  Yes Amber Williard A, PA-C  potassium chloride SA (KLOR-CON) 20 MEQ tablet Take 1 tablet (20 mEq total) by mouth 2 (two) times daily for 5 days. 05/18/20 05/23/20 Yes Aleeta Schmaltz A, PA-C  sitaGLIPtin-metformin (JANUMET) 50-1000 MG tablet Take 1 tablet by mouth 2 (two) times a day.   Yes [provider]    Allergies    Iodine  Review of Systems   Review of Systems  Constitutional: Negative for activity change, chills and fever.  HENT: Negative for congestion and sore throat.   Respiratory: Negative for cough, shortness of breath and wheezing.   Cardiovascular: Negative for chest pain, palpitations and leg swelling.  Gastrointestinal: Positive for abdominal pain, diarrhea, nausea and vomiting. Negative for anal bleeding, blood in stool and constipation.  Genitourinary: Negative for  dysuria, flank pain, frequency, genital sores, hematuria, pelvic pain, urgency, vaginal bleeding, vaginal discharge and vaginal pain.  Musculoskeletal: Negative for back pain.  Skin: Negative for rash.  Allergic/Immunologic: Negative for immunocompromised state.  Neurological: Negative for dizziness, seizures, syncope, weakness, numbness and headaches.  Psychiatric/Behavioral: Negative for confusion.    Physical Exam Updated Vital Signs BP 137/80   Pulse 79   Temp 99 F (37.2 C) (Oral)   Resp 16   LMP 04/20/2008   SpO2 95%   Physical Exam Vitals and nursing note reviewed.  Constitutional:      General: She is not in acute distress.    Appearance: She is ill-appearing. She is not toxic-appearing or diaphoretic.  HENT:     Head: Normocephalic.     Mouth/Throat:     Mouth: Mucous membranes are dry.  Eyes:     Conjunctiva/sclera: Conjunctivae normal.  Cardiovascular:     Rate and Rhythm: Normal rate and regular  rhythm.     Heart sounds: No murmur heard. No friction rub. No gallop.   Pulmonary:     Effort: Pulmonary effort is normal. No respiratory distress.     Breath sounds: No stridor. No wheezing, rhonchi or rales.  Chest:     Chest wall: No tenderness.  Abdominal:     General: There is no distension.     Palpations: Abdomen is soft. There is no mass.     Tenderness: There is abdominal tenderness. There is guarding. There is no right CVA tenderness, left CVA tenderness or rebound.     Hernia: No hernia is present.     Comments: Tender to palpation of the left pelvic region with guarding.  No rebound.  No CVA tenderness bilaterally.  Abdomen is soft and nondistended.  Hypoactive bowel sounds in all 4 quadrants.   Musculoskeletal:     Cervical back: Neck supple.     Right lower leg: No edema.     Left lower leg: No edema.  Skin:    General: Skin is warm.     Capillary Refill: Capillary refill takes 2 to 3 seconds.     Findings: No rash.  Neurological:     Mental  Status: She is alert.  Psychiatric:        Behavior: Behavior normal.     ED Results / Procedures / Treatments   Labs (all labs ordered are listed, but only abnormal results are displayed) Labs Reviewed  COMPREHENSIVE METABOLIC PANEL - Abnormal; Notable for the following components:      Result Value   Potassium 2.9 (*)    CO2 21 (*)    Glucose, Bld 237 (*)    Anion gap 18 (*)    All other components within normal limits  CBC - Abnormal; Notable for the following components:   WBC 14.4 (*)    All other components within normal limits  URINALYSIS, ROUTINE W REFLEX MICROSCOPIC - Abnormal; Notable for the following components:   Specific Gravity, Urine >1.046 (*)    Glucose, UA 150 (*)    Ketones, ur 20 (*)    Protein, ur 100 (*)    All other components within normal limits  MAGNESIUM - Abnormal; Notable for the following components:   Magnesium 1.6 (*)    All other components within normal limits  LACTIC ACID, PLASMA - Abnormal; Notable for the following components:   Lactic Acid, Venous 2.1 (*)    All other components within normal limits  CBG MONITORING, ED - Abnormal; Notable for the following components:   Glucose-Capillary 225 (*)    All other components within normal limits  LIPASE, BLOOD  LACTIC ACID, PLASMA  TROPONIN I (HIGH SENSITIVITY)  TROPONIN I (HIGH SENSITIVITY)    EKG EKG Interpretation  Date/Time:  Monday May 17 2020 20:25:45 EST Ventricular Rate:  120 PR Interval:  136 QRS Duration: 80 QT Interval:  346 QTC Calculation: 489 R Axis:   72 Text Interpretation: Sinus tachycardia Otherwise normal ECG Confirmed by Addison Lank [phone]) on 05/17/2020 11:06:25 PM   Radiology CT ABDOMEN PELVIS W CONTRAST  Result Date: 05/18/2020 CLINICAL DATA:  Left lower quadrant pain EXAM: CT ABDOMEN AND PELVIS WITH CONTRAST TECHNIQUE: Multidetector CT imaging of the abdomen and pelvis was performed using the standard protocol following bolus administration of  intravenous contrast. CONTRAST:  121mL OMNIPAQUE IOHEXOL 300 MG/ML  SOLN COMPARISON:  None. FINDINGS: Lower chest: No acute abnormality. Hepatobiliary: No focal liver abnormality is seen. No gallstones, gallbladder  wall thickening, or biliary dilatation. Pancreas: Unremarkable. No pancreatic ductal dilatation or surrounding inflammatory changes. Spleen: Normal in size without focal abnormality. Adrenals/Urinary Tract: Adrenal glands are within normal limits. Kidneys demonstrate a normal enhancement pattern. No renal calculi or obstructive changes are noted. The bladder is partially distended. Stomach/Bowel: No obstructive or inflammatory changes of the colon are seen. The appendix is within normal limits. Small bowel and stomach are within normal limits. Vascular/Lymphatic: Aortic atherosclerosis. No enlarged abdominal or pelvic lymph nodes. Reproductive: Scattered calcified uterine fibroids are noted. Other: No abdominal wall hernia or abnormality. No abdominopelvic ascites. Musculoskeletal: No acute or significant osseous findings. IMPRESSION: Calcified uterine fibroids. No other focal abnormality is noted. Electronically Signed   By: Inez Catalina M.D.   On: 05/18/2020 01:05   US PELVIC COMPLETE W TRANSVAGINAL AND TORSION R/O  Result Date: 05/18/2020 CLINICAL DATA:  Left lower quadrant pain, history of fibroids EXAM: TRANSABDOMINAL AND TRANSVAGINAL ULTRASOUND OF PELVIS DOPPLER ULTRASOUND OF OVARIES TECHNIQUE: Both transabdominal and transvaginal ultrasound examinations of the pelvis were performed. Transabdominal technique was performed for global imaging of the pelvis including uterus, ovaries, adnexal regions, and pelvic cul-de-sac. It was necessary to proceed with endovaginal exam following the transabdominal exam to visualize the uterus, endometrium, and ovaries. Color and duplex Doppler ultrasound was utilized to evaluate blood flow to the ovaries. COMPARISON:  CT 05/18/2020 FINDINGS: Uterus Measurements:  12.2 x 4.1 x 5.4 cm = volume: 143.6 mL. Anteverted uterus. The uterine myometrium is diffusely heterogeneous and coarsened containing multiple echogenic, posteriorly shadowing calcified uterine fibroids. Largest is seen towards the right uterine fundus measuring 2.3 x 2.1 x 1.9 cm. The second is seen in the more posterior right uterine fundus measuring 1.8 x 1.3 x 1.8 cm. The third largest is seen in the left anterior uterine body measuring 1.1 x 0.9 x 1.1 cm. Endometrium Thickness: 7.4 mm, mildly thickened. No focal abnormality visualized. Right ovary Not well visualized Left ovary Measurements: 2.6 x 1.5 x 2.1 cm = volume: 4.4 mL. Normal appearance/no adnexal mass. Pulsed Doppler evaluation of both ovaries demonstrates normal low-resistance arterial and venous waveforms. Other findings Numerous loops of bowel are seen in the deep pelvis obscuring evaluation of the adnexa. Small volume free fluid is seen in the posterior cul-de-sac. IMPRESSION: 1. Coarsened uterine myometrium with numerous partially calcified uterine fibroids, the largest measures 2.3 x 2.1 x 1.9 cm in the right uterine fundus. 2. Extensive loops of bowel in the low abdomen limited assessment of the adnexa with nonvisualization of the right ovary. 3. No left ovarian abnormality or evidence of left ovarian torsion. 4. Small volume free fluid in the pelvis, nonspecific. 5. Mild endometrial thickening at 7 mm, slightly greater than expected for this postmenopausal patient though is a nonspecific finding in the absence of abnormal uterine bleeding. Consider outpatient gynecologic consultation for further management. Electronically Signed   By: Lovena Le M.D.   On: 05/18/2020 03:50    Procedures Procedures   Medications Ordered in ED Medications  ondansetron (ZOFRAN-ODT) disintegrating tablet 4 mg (4 mg Oral Given 05/17/20 2014)  morphine 4 MG/ML injection 4 mg (4 mg Intravenous Given 05/18/20 0030)  sodium chloride 0.9 % bolus 1,000 mL (1,000  mLs Intravenous New Bag/Given 05/18/20 0024)  metoCLOPramide (REGLAN) injection 10 mg (10 mg Intravenous Given 05/18/20 0023)  potassium chloride 10 mEq in 100 mL IVPB (0 mEq Intravenous Stopped 05/18/20 0510)  iohexol (OMNIPAQUE) 300 MG/ML solution 100 mL (100 mLs Intravenous Contrast Given 05/18/20 0033)  magnesium  sulfate IVPB 2 g 50 mL (0 g Intravenous Stopped 05/18/20 0414)  morphine 4 MG/ML injection 4 mg (4 mg Intravenous Given 05/18/20 0415)    ED Course  I have reviewed the triage vital signs and the nursing notes.  Pertinent labs & imaging results that were available during my care of the patient were reviewed by me and considered in my medical decision making (see chart for details).    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  She also added chest pain and sore throat in the emergency department, but these have since resolved and I suspect that they were secondary to vomiting.  Tachycardic on arrival to the ER.  Afebrile.  Normotensive.  On exam, she is focally tender to palpation of the left lower quadrant and left pelvic region.  Pain appears to be lower and in the left pelvic region with associated guarding.  No rebound.  Differential diagnosis includes diverticulitis, ovarian torsion, UTI, obstructive uropathy, pyelonephritis, bowel obstruction, atypical presenting appendicitis, cholecystitis, pancreatitis, PID, or viral gastroenteritis.  Labs are notable for leukocytosis.  She has hypokalemia to 2.9 and hypomagnesemia to 1.6.  These were replenished via IV.  Lactate was initially elevated to 2.1 and patient had slightly decreased bicarb with an elevated anion gap.  I suspect this is secondary to dehydration from  frequent vomiting and diarrhea.  CT scan was obtained and was unremarkable.  Given that pain was more pelvic in nature and began suddenly, a pelvic ultrasound was ordered to assess for ovarian torsion.  No evidence of torsion or other acute findings to explain the patient's symptoms.  UA is not concerning for infection.  Suspect viral etiology given sudden onset of symptoms.  Doubt C. difficile colitis.  On reevaluation, patient's pain was significantly improved.  Nausea had resolved.  She has had no further episodes of vomiting.  She is tolerating fluids at bedside.  She feels stable and ready for discharge.  Will discharge home with potassium chloride and Zofran.  She will follow up with primary care.  ER return precautions given.  She is hemodynamically stable and in no acute distress.  Safe for discharge home with outpatient follow-up as indicated.  Final Clinical Impression(s) / ED Diagnoses Final diagnoses:  Nausea vomiting and diarrhea  Left lower quadrant abdominal pain  Dehydration  Hypokalemia  Hypomagnesemia    Rx / DC Orders ED Discharge Orders         Ordered    ondansetron (ZOFRAN ODT) 4 MG disintegrating tablet  Every 8 hours PRN        05/18/20 0456    potassium chloride SA (KLOR-CON) 20 MEQ tablet  2 times daily        05/18/20 0456           Joanne Gavel, PA-C 05/18/20 0510    Fatima Blank, MD 05/18/20 1758

## 2020-05-18 NOTE — ED Notes (Signed)
Patient verbalizes understanding of discharge instructions. Opportunity for questioning and answers were provided. Armband removed by staff, pt discharged from ED and ambulated to lobby to return home.   

## 2020-06-18 ENCOUNTER — Encounter (HOSPITAL_COMMUNITY): Payer: Self-pay | Admitting: Emergency Medicine

## 2020-06-18 ENCOUNTER — Emergency Department (HOSPITAL_COMMUNITY): Payer: BC Managed Care – PPO

## 2020-06-18 ENCOUNTER — Emergency Department (HOSPITAL_COMMUNITY)
Admission: EM | Admit: 2020-06-18 | Discharge: 2020-06-18 | Disposition: A | Discharge status: Home / Self Care | Payer: BC Managed Care – PPO | Attending: Emergency Medicine | Admitting: Emergency Medicine

## 2020-06-18 ENCOUNTER — Other Ambulatory Visit: Payer: Self-pay

## 2020-06-18 DIAGNOSIS — Z7982 Long term (current) use of aspirin: Secondary | ICD-10-CM | POA: Diagnosis not present

## 2020-06-18 DIAGNOSIS — I10 Essential (primary) hypertension: Secondary | ICD-10-CM | POA: Diagnosis not present

## 2020-06-18 DIAGNOSIS — Z85850 Personal history of malignant neoplasm of thyroid: Secondary | ICD-10-CM | POA: Diagnosis not present

## 2020-06-18 DIAGNOSIS — Z79899 Other long term (current) drug therapy: Secondary | ICD-10-CM | POA: Diagnosis not present

## 2020-06-18 DIAGNOSIS — E039 Hypothyroidism, unspecified: Secondary | ICD-10-CM | POA: Insufficient documentation

## 2020-06-18 DIAGNOSIS — E119 Type 2 diabetes mellitus without complications: Secondary | ICD-10-CM | POA: Diagnosis not present

## 2020-06-18 DIAGNOSIS — R1011 Right upper quadrant pain: Secondary | ICD-10-CM

## 2020-06-18 DIAGNOSIS — Z7984 Long term (current) use of oral hypoglycemic drugs: Secondary | ICD-10-CM | POA: Insufficient documentation

## 2020-06-18 DIAGNOSIS — R109 Unspecified abdominal pain: Secondary | ICD-10-CM

## 2020-06-18 LAB — URINALYSIS, ROUTINE W REFLEX MICROSCOPIC
Bilirubin Urine: NEGATIVE
Glucose, UA: NEGATIVE mg/dL
Hgb urine dipstick: NEGATIVE
Ketones, ur: 20 mg/dL — AB
Leukocytes,Ua: NEGATIVE
Nitrite: NEGATIVE
Protein, ur: NEGATIVE mg/dL
Specific Gravity, Urine: 1.015 (ref 1.005–1.030)
pH: 5 (ref 5.0–8.0)

## 2020-06-18 LAB — CBC WITH DIFFERENTIAL/PLATELET
Abs Immature Granulocytes: 0.06 10*3/uL (ref 0.00–0.07)
Basophils Absolute: 0.1 10*3/uL (ref 0.0–0.1)
Basophils Relative: 1 %
Eosinophils Absolute: 0.2 10*3/uL (ref 0.0–0.5)
Eosinophils Relative: 2 %
HCT: 47 % — ABNORMAL HIGH (ref 36.0–46.0)
Hemoglobin: 15 g/dL (ref 12.0–15.0)
Immature Granulocytes: 1 %
Lymphocytes Relative: 24 %
Lymphs Abs: 2.7 10*3/uL (ref 0.7–4.0)
MCH: 26.5 pg (ref 26.0–34.0)
MCHC: 31.9 g/dL (ref 30.0–36.0)
MCV: 83.2 fL (ref 80.0–100.0)
Monocytes Absolute: 0.9 10*3/uL (ref 0.1–1.0)
Monocytes Relative: 8 %
Neutro Abs: 7.5 10*3/uL (ref 1.7–7.7)
Neutrophils Relative %: 64 %
Platelets: 311 10*3/uL (ref 150–400)
RBC: 5.65 MIL/uL — ABNORMAL HIGH (ref 3.87–5.11)
RDW: 14.4 % (ref 11.5–15.5)
WBC: 11.5 10*3/uL — ABNORMAL HIGH (ref 4.0–10.5)
nRBC: 0 % (ref 0.0–0.2)

## 2020-06-18 LAB — COMPREHENSIVE METABOLIC PANEL
ALT: 18 U/L (ref 0–44)
AST: 19 U/L (ref 15–41)
Albumin: 4.5 g/dL (ref 3.5–5.0)
Alkaline Phosphatase: 51 U/L (ref 38–126)
Anion gap: 10 (ref 5–15)
BUN: 24 mg/dL — ABNORMAL HIGH (ref 8–23)
CO2: 32 mmol/L (ref 22–32)
Calcium: 9 mg/dL (ref 8.9–10.3)
Chloride: 96 mmol/L — ABNORMAL LOW (ref 98–111)
Creatinine, Ser: 1.14 mg/dL — ABNORMAL HIGH (ref 0.44–1.00)
GFR, Estimated: 55 mL/min — ABNORMAL LOW (ref >60)
Glucose, Bld: 145 mg/dL — ABNORMAL HIGH (ref 70–99)
Potassium: 3.2 mmol/L — ABNORMAL LOW (ref 3.5–5.1)
Sodium: 138 mmol/L (ref 135–145)
Total Bilirubin: 0.9 mg/dL (ref 0.3–1.2)
Total Protein: 7.9 g/dL (ref 6.5–8.1)

## 2020-06-18 LAB — LIPASE, BLOOD: Lipase: 36 U/L (ref 11–51)

## 2020-06-18 MED ORDER — LACTATED RINGERS IV BOLUS
1000.0000 mL | Freq: Once | INTRAVENOUS | Status: AC
  Administered 2020-06-18: 1000 mL via INTRAVENOUS

## 2020-06-18 MED ORDER — MORPHINE SULFATE (PF) 4 MG/ML IV SOLN
4.0000 mg | Freq: Once | INTRAVENOUS | Status: AC
Start: 2020-06-18 — End: 2020-06-18
  Administered 2020-06-18: 4 mg via INTRAVENOUS
  Filled 2020-06-18: qty 1

## 2020-06-18 MED ORDER — ONDANSETRON HCL 4 MG/2ML IJ SOLN
4.0000 mg | Freq: Once | INTRAMUSCULAR | Status: AC
  Administered 2020-06-18: 4 mg via INTRAVENOUS
  Filled 2020-06-18: qty 2

## 2020-06-18 NOTE — ED Triage Notes (Signed)
Pt here from home with c/o right side flank pain along with n/v pt states that the same thing happened about a few weeks ago was seen here in the Ed

## 2020-06-18 NOTE — ED Notes (Signed)
All appropriate discharge materials reviewed at length with patient. Time for questions provided. Pt has no other questions at this time and verbalizes understanding of all provided materials.  

## 2020-06-18 NOTE — ED Provider Notes (Signed)
[facility] EMERGENCY DEPARTMENT Provider Note   CSN: [id number] Arrival date & time: 06/18/20  0930     History No chief complaint on file.   Terri Klein is a 60 y.o. female.  Right-sided abdominal pain.  Sent from urgent care with concerns for cholecystitis.  Pain is in the right flank right lower abdomen right upper abdomen.  Feels similar to pain she had a month ago that was on the left side the past on its own.  She claims to have decreased bowel movements decreased urination no fevers.  She has been vomiting.  No sick contacts no trauma.  Pain is moderate to severe.  She has tried no medication to help.        Past Medical History:  Diagnosis Date  . Diabetes mellitus without complication ([place])   . Hyperlipidemia   . Hypertension   . Kidney stone    during her 20's    Patient Active Problem List   Diagnosis Date Noted  . 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 Essential hypertension 12/24/2015  . Type 2 diabetes mellitus ([place]) 12/24/2015  . Healthcare maintenance 12/24/2015  . Hyperlipidemia associated with type 2 diabetes mellitus ([place]) 12/24/2015    Past Surgical History:  Procedure Laterality Date  . THYROIDECTOMY N/A 08/09/2018   Procedure: TOTAL THYROIDECTOMY;  Surgeon: Armandina Gemma, MD;  Location: [facility];  Service: General;  Laterality: N/A;     OB History   No obstetric history on file.     Family History  Problem Relation Age of Onset  . Cancer Mother        Cervical  . Diabetes Mother   . Hypertension Mother   . Hyperlipidemia Brother   . Hypertension Maternal Grandmother   . Heart disease Maternal Grandmother   . Diabetes Maternal Grandmother     Social History   Tobacco Use  . Smoking status: Never Smoker  . Smokeless tobacco: Never Used  Substance Use Topics  . Alcohol use: Yes    Comment: socially  . Drug use: No    Home  Medications Prior to Admission medications   Medication Sig Start Date End Date Taking? Authorizing Provider  amLODipine-benazepril (LOTREL) 10-40 MG capsule Take 1 capsule by mouth daily. 12/24/15   Mignon Pine, DO  aspirin EC 81 MG tablet Take 1 tablet (81 mg total) by mouth daily. 12/24/15   Mignon Pine, DO  atorvastatin (LIPITOR) 20 MG tablet Take 20 mg by mouth daily.    [provider]  BLACK ELDERBERRY PO Take 1 tablet by mouth every other day.    [provider]  glimepiride (AMARYL) 4 MG tablet Take 1 tablet (4 mg total) by mouth daily with breakfast. 12/24/15   Mignon Pine, DO  levothyroxine (SYNTHROID) 137 MCG tablet Take 1 tablet (137 mcg total) by mouth daily before breakfast. 01/02/20   Philemon Kingdom, MD  metoprolol (LOPRESSOR) 50 MG tablet Take 1 tablet (50 mg total) by mouth daily. 12/24/15 09/19/19  Mignon Pine, DO  multivitamin-iron-minerals-folic acid (CENTRUM) chewable tablet Chew 1 tablet by mouth daily. 12/24/15   Mignon Pine, DO  ondansetron (ZOFRAN ODT) 4 MG disintegrating tablet Take 1 tablet (4 mg total) by mouth every 8 (eight) hours as needed. 05/18/20   McDonald, Mia A, PA-C  potassium chloride SA (KLOR-CON) 20 MEQ tablet Take 1 tablet (20 mEq total) by mouth 2 (two) times daily for 5  days. 05/18/20 05/23/20  McDonald, Maree Erie A, PA-C  sitaGLIPtin-metformin (JANUMET) 50-1000 MG tablet Take 1 tablet by mouth 2 (two) times a day.    [provider]    Allergies    Iodine  Review of Systems   Review of Systems  Constitutional: Positive for appetite change. Negative for chills and fever.  HENT: Negative for congestion and rhinorrhea.   Respiratory: Negative for cough and shortness of breath.   Cardiovascular: Negative for chest pain and palpitations.  Gastrointestinal: Positive for abdominal pain, nausea and vomiting. Negative for diarrhea.  Genitourinary: Positive for flank pain. Negative for difficulty urinating  and dysuria.  Musculoskeletal: Negative for arthralgias and back pain.  Skin: Negative for rash and wound.  Neurological: Negative for light-headedness and headaches.    Physical Exam Updated Vital Signs BP 132/85   Pulse 75   Temp 98.7 F (37.1 C)   Resp 13   LMP 04/20/2008   SpO2 99%   Physical Exam Vitals and nursing note reviewed. Exam conducted with a chaperone present.  Constitutional:      General: She is not in acute distress.    Appearance: Normal appearance.  HENT:     Head: Normocephalic and atraumatic.     Nose: No rhinorrhea.  Eyes:     General:        Right eye: No discharge.        Left eye: No discharge.     Conjunctiva/sclera: Conjunctivae normal.  Cardiovascular:     Rate and Rhythm: Normal rate and regular rhythm.  Pulmonary:     Effort: Pulmonary effort is normal. No respiratory distress.     Breath sounds: No stridor.  Abdominal:     General: Abdomen is flat. There is no distension.     Palpations: Abdomen is soft.     Tenderness: There is abdominal tenderness. There is right CVA tenderness. There is no guarding or rebound.     Hernia: No hernia is present.  Musculoskeletal:        General: No tenderness or signs of injury.  Skin:    General: Skin is warm and dry.  Neurological:     General: No focal deficit present.     Mental Status: She is alert. Mental status is at baseline.     Motor: No weakness.  Psychiatric:        Mood and Affect: Mood normal.        Behavior: Behavior normal.     ED Results / Procedures / Treatments   Labs (all labs ordered are listed, but only abnormal results are displayed) Labs Reviewed  COMPREHENSIVE METABOLIC PANEL - Abnormal; Notable for the following components:      Result Value   Potassium 3.2 (*)    Chloride 96 (*)    Glucose, Bld 145 (*)    BUN 24 (*)    Creatinine, Ser 1.14 (*)    GFR, Estimated 55 (*)    All other components within normal limits  CBC WITH DIFFERENTIAL/PLATELET - Abnormal;  Notable for the following components:   WBC 11.5 (*)    RBC 5.65 (*)    HCT 47.0 (*)    All other components within normal limits  URINALYSIS, ROUTINE W REFLEX MICROSCOPIC - Abnormal; Notable for the following components:   APPearance HAZY (*)    Ketones, ur 20 (*)    All other components within normal limits  LIPASE, BLOOD    EKG None  Radiology CT ABDOMEN PELVIS WO CONTRAST  Result Date: 06/18/2020 CLINICAL DATA:  Right lower quadrant abdominal pain. EXAM: CT ABDOMEN AND PELVIS WITHOUT CONTRAST TECHNIQUE: Multidetector CT imaging of the abdomen and pelvis was performed following the standard protocol without IV contrast. COMPARISON:  Ultrasound abdomen 06/16/2020, CT abdomen pelvis 05/18/2020 FINDINGS: Lower chest: Bilateral lower lobe subsegmental atelectasis. Hepatobiliary: No focal liver abnormality. No gallstones, gallbladder wall thickening, or pericholecystic fluid. No biliary dilatation. Pancreas: No focal lesion. Normal pancreatic contour. No surrounding inflammatory changes. No main pancreatic ductal dilatation. Spleen: Normal in size without focal abnormality. Adrenals/Urinary Tract: No adrenal nodule bilaterally. Couple of punctate calcifications within the right kidney. No hydronephrosis and no contour-deforming renal mass. No ureterolithiasis or hydroureter. The urinary bladder is unremarkable. Stomach/Bowel: Stomach is within normal limits. No evidence of bowel wall thickening or dilatation. Few scattered colonic diverticula. Appendix appears normal. Vascular/Lymphatic: No abdominal aorta or iliac aneurysm. Mild atherosclerotic plaque of the aorta and its branches. No abdominal, pelvic, or inguinal lymphadenopathy. Reproductive: Coarse calcifications within the uterine wall suggestive of degenerative fibroids. Otherwise the uterus and bilateral adnexal regions are unremarkable. Other: No intraperitoneal free fluid. No intraperitoneal free gas. No organized fluid collection.  Musculoskeletal: No abdominal wall hernia or abnormality. No suspicious lytic or blastic osseous lesions. No acute displaced fracture. Multilevel degenerative changes of the spine. IMPRESSION: 1. Couple of nonobstructive punctate right nephrolithiasis. 2. Normal appendix. 3. Few scattered colonic diverticula with no findings of acute diverticulitis. 4. Uterine fibroids. 5.  Aortic Atherosclerosis (ICD10-I70.0). Electronically Signed   By: Iven Finn M.D.   On: 06/18/2020 15:22   US Abdomen Limited RUQ (LIVER/GB)  Result Date: 06/18/2020 CLINICAL DATA:  Right upper quadrant pain. EXAM: ULTRASOUND ABDOMEN LIMITED RIGHT UPPER QUADRANT COMPARISON:  CT abdomen and pelvis from May 18, 2020. FINDINGS: Gallbladder: No gallstones or wall thickening visualized. No sonographic Murphy sign noted by sonographer. Common bile duct: Diameter: 6 mm, borderline dilated. Liver: No focal lesion identified. Within normal limits in parenchymal echogenicity, hypoechoic relative to the adjacent kidney. No evidence of hepatic steatosis on recent CT abdomen/pelvis. Portal vein is patent on color Doppler imaging with normal direction of blood flow towards the liver. Other: Query increased echogenicity of the partially imaged right kidney. IMPRESSION: 1. No sonographic evidence of acute cholecystitis. 2. Borderline dilation of the proximal common bile duct. Consider correlation with liver enzymes to exclude obstruction. If there is clinical concern, MRCP could further evaluate. 3. Query increased echogenicity of the partially imaged right kidney, which is nonspecific but can be seen with chronic kidney disease or as a normal variant. Electronically Signed   By: Margaretha Sheffield MD   On: 06/18/2020 11:47    Procedures Procedures   Medications Ordered in ED Medications  lactated ringers bolus 1,000 mL (0 mLs Intravenous Stopped 06/18/20 1325)  ondansetron (ZOFRAN) injection 4 mg (4 mg Intravenous Given 06/18/20 1213)  morphine 4  MG/ML injection 4 mg (4 mg Intravenous Given 06/18/20 1214)    ED Course  I have reviewed the triage vital signs and the nursing notes.  Pertinent labs & imaging results that were available during my care of the patient were reviewed by me and considered in my medical decision making (see chart for details).    MDM Rules/Calculators/A&P                          Right upper quadrant pain sent from urgent care.  Ultrasound reviewed by radiology shows no signs of cholecystitis, mild dilation of  the common bile duct but after review of the labs patient has no significant hepatobiliary abnormalities.  She has a mild leukocytosis.  Urinalysis without signs of infection or other complication.  CT is then done as the patient has a history of renal stones.  She has scattered renal calculi but nonobstructing.  This could be the cause of her pain.  Patient has much better pain control after my reassessment.  Is able to tolerate p.o.  Is desiring discharge home.  I feel she needs follow-up with urology for chronic renal stones.  She also needs follow-up for gastroenterology if she is having intermittent abdominal pain with no focal origin at this time.  Strict return precautions are discussed family and patient were counseled on plan and agree to the plan.   Final Clinical Impression(s) / ED Diagnoses Final diagnoses:  RUQ pain  Undifferentiated abdominal pain    Rx / DC Orders ED Discharge Orders    None       Breck Coons, MD 06/19/20 [phone]

## 2020-06-18 NOTE — Discharge Instructions (Addendum)
You can take 600 mg of ibuprofen every 6 hours, you can take 1000 mg of Tylenol every 6 hours, you can alternate these every 3 or you can take them together.  Return to the emergency department for any concerning changes.  Follow-up with your primary care provider for repeat laboratory studies and hydration check.

## 2020-06-18 NOTE — ED Notes (Signed)
Patient transported to Ultrasound 

## 2020-06-21 ENCOUNTER — Encounter: Payer: Self-pay | Admitting: Urology

## 2020-06-21 ENCOUNTER — Encounter: Payer: Self-pay | Admitting: Physician Assistant

## 2020-07-07 ENCOUNTER — Ambulatory Visit: Payer: BC Managed Care – PPO | Admitting: Physician Assistant

## 2020-07-07 ENCOUNTER — Other Ambulatory Visit: Payer: Self-pay

## 2020-07-07 ENCOUNTER — Encounter: Payer: Self-pay | Admitting: Physician Assistant

## 2020-07-07 VITALS — BP 110/72 | HR 94 | Ht 67.0 in | Wt 206.0 lb

## 2020-07-07 DIAGNOSIS — R1310 Dysphagia, unspecified: Secondary | ICD-10-CM | POA: Diagnosis not present

## 2020-07-07 DIAGNOSIS — K219 Gastro-esophageal reflux disease without esophagitis: Secondary | ICD-10-CM | POA: Diagnosis not present

## 2020-07-07 DIAGNOSIS — R1084 Generalized abdominal pain: Secondary | ICD-10-CM

## 2020-07-07 DIAGNOSIS — R112 Nausea with vomiting, unspecified: Secondary | ICD-10-CM | POA: Diagnosis not present

## 2020-07-07 MED ORDER — PANTOPRAZOLE SODIUM 40 MG PO TBEC: 40.0000 mg | DELAYED_RELEASE_TABLET | Freq: Every day | ORAL | 6 refills | Status: DC

## 2020-07-07 NOTE — Patient Instructions (Signed)
If you are age 62 or older, your body mass index should be between 23-30. Your Body mass index is 32.26 kg/m. If this is out of the aforementioned range listed, please consider follow up with your Primary Care Provider.  If you are age 45 or younger, your body mass index should be between 19-25. Your Body mass index is 32.26 kg/m. If this is out of the aformentioned range listed, please consider follow up with your Primary Care Provider.   You have been scheduled for an endoscopy. Please follow written instructions given to you at your visit today. If you use inhalers (even only as needed), please bring them with you on the day of your procedure.  START Pantoprazole 40 mg 1 tablet before breakfast.  Follow an Anti-Reflux diet.  Follow up pending the results of your Endoscopy or as needed.  Thank you for entrusting me with your care and choosing [facility].  Amy Esterwood, PA-C

## 2020-07-07 NOTE — Progress Notes (Signed)
Subjective:    Patient ID: Terri Klein, female    DOB: 31-Jul-1960, 60 y.o.   MRN: [id number]  HPI   Terri Klein is a pleasant 60 year old African-American female, referred by Shanon Rosser  PA-C for evaluation of dysphagia.  Patient has also had recurrent episodes of abdominal pain nausea and vomiting.  She has not had prior GI evaluation here but has had prior colonoscopies when she was living in [place].  She says that she had a colonoscopy about 7 years ago and another one about 5 years ago both done at [facility] and does have history of colon polyps.  She believes she was told to have a follow-up in 5 years. She reports she did a Cologuard here in [place] about 2 years ago which was negative. Family history negative for colon cancer. Patient has history of hypertension, hyperlipidemia papillary thyroid cancer for which she underwent thyroidectomy and adult onset diabetes mellitus. She had recent ER visit on 06/18/2020 with complaints of mid abdominal pain nausea and vomiting.  She says she has not had any recurrence of that since.  She did not undergo upper abdominal ultrasound which was unremarkable other than possible borderline dilation of the proximal CBD.  CT of the abdomen pelvis done at that same setting showed a normal-appearing liver and gallbladder no ductal dilation, she had a couple of punctate calcifications in the right kidney and a fibroid uterus.  LFTs were normal, creatinine 1.1/potassium 3.2/lipase within normal limits Hemoglobin 15 hematocrit 47. Patient says that she has had 3 episodes of this similar abdominal pain with nausea and vomiting since September 2021.  She says she gets significant pain in her mid abdomen is unable to eat for a couple of days has associated nausea and vomiting and some pain radiates around bilaterally into her back.  She feels fine in between these episodes. She does have ongoing issues with heartburn and indigestion and has developed  dysphagia primarily to solids and starchy foods which she says just "sit" in my esophagus.  She has been having to drink fluids to push her food down.  This has been occurring over the past couple of years since the thyroidectomy with some recent progression.  No episodes requiring regurgitation.  She has had increasing frequency of the symptoms recently. She also endorses symptoms of early satiety, says she usually eats small amounts and her appetite is not as good as it had been in past years though she has been maintaining her weight. She has not been on any PPI therapy. No regular and denies EtOH.  She does take a baby aspirin daily  Review of Systems Pertinent positive and negative review of systems were noted in the above HPI section.  All other review of systems was otherwise negative.  Outpatient Encounter Medications as of 07/07/2020  Medication Sig  . amLODipine-benazepril (LOTREL) 10-40 MG capsule Take 1 capsule by mouth daily.  Marland Kitchen aspirin EC 81 MG tablet Take 1 tablet (81 mg total) by mouth daily.  Marland Kitchen atorvastatin (LIPITOR) 20 MG tablet Take 20 mg by mouth daily.  Marland Kitchen BLACK ELDERBERRY PO Take 1 tablet by mouth every other day.  Marland Kitchen glimepiride (AMARYL) 4 MG tablet Take 1 tablet (4 mg total) by mouth daily with breakfast.  . levothyroxine (SYNTHROID) 137 MCG tablet Take 1 tablet (137 mcg total) by mouth daily before breakfast.  . multivitamin-iron-minerals-folic acid (CENTRUM) chewable tablet Chew 1 tablet by mouth daily.  . ondansetron (ZOFRAN ODT) 4 MG disintegrating  tablet Take 1 tablet (4 mg total) by mouth every 8 (eight) hours as needed.  . pantoprazole (PROTONIX) 40 MG tablet Take 1 tablet (40 mg total) by mouth daily before breakfast.  . sitaGLIPtin-metformin (JANUMET) 50-1000 MG tablet Take 1 tablet by mouth 2 (two) times a day.  . metoprolol (LOPRESSOR) 50 MG tablet Take 1 tablet (50 mg total) by mouth daily.  . potassium chloride SA (KLOR-CON) 20 MEQ tablet Take 1 tablet (20 mEq  total) by mouth 2 (two) times daily for 5 days.   No facility-administered encounter medications on file as of 07/07/2020.   Allergies  Allergen Reactions  . Iodine     immobilized   Patient Active Problem List   Diagnosis Date Noted  . 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 Essential hypertension 12/24/2015  . Type 2 diabetes mellitus ([place]) 12/24/2015  . Healthcare maintenance 12/24/2015  . Hyperlipidemia associated with type 2 diabetes mellitus ([place]) 12/24/2015   Social History   Socioeconomic History  . Marital status: Single    Spouse name: Not on file  . Number of children: 2  . Years of education: Not on file  . Highest education level: Not on file  Occupational History  . Occupation: custodian  Tobacco Use  . Smoking status: Never Smoker  . Smokeless tobacco: Never Used  Substance and Sexual Activity  . Alcohol use: Yes    Comment: socially  . Drug use: No  . Sexual activity: Yes    Birth control/protection: Condom  Other Topics Concern  . Not on file  Social History Narrative  . Not on file   Social Determinants of Health   Financial Resource Strain: Not on file  Food Insecurity: Not on file  Transportation Needs: Not on file  Physical Activity: Not on file  Stress: Not on file  Social Connections: Not on file  Intimate Partner Violence: Not on file    Terri Klein's family history includes Cancer in her mother; Diabetes in her maternal grandmother and mother; Heart disease in her maternal grandmother; Hyperlipidemia in her brother; Hypertension in her maternal grandmother and mother.      Objective:    Vitals:   07/07/20 1431  BP: 110/72  Pulse: 94    Physical Exam Well-developed well-nourished older African-American female in no acute distress.  Height, Weight, 206 BMI 32.2  HEENT; nontraumatic normocephalic, EOMI, PE R LA, sclera anicteric. Oropharynx; Neck; supple, no  JVD Cardiovascular; regular rate and rhythm with S1-S2, no murmur rub or gallop Pulmonary; Clear bilaterally Abdomen; soft, nontender, nondistended, no palpable mass or hepatosplenomegaly, bowel sounds are active Rectal; Skin; benign exam, no jaundice rash or appreciable lesions Extremities; no clubbing cyanosis or edema skin warm and dry Neuro/Psych; alert and oriented x4, grossly nonfocal mood and affect appropriate       Assessment & Plan:   #46 60 year old African-American female with solid food dysphagia symptomatic over the past year or so with some progression recently.  She has frequent episodes of dysphagia but no regurgitation.  Also with chronic heartburn and indigestion  Suspect GERD with peptic stricture, rule out esophageal ring, rule out dysmotility  #2 history of thyroid cancer status post thyroidectomy #3 intermittent episodes of mid abdominal pain nausea and vomiting 3 occurrences over the past year.  Recent work-up in the emergency room with ultrasound and CT imaging both negative.  There was question of borderline dilation of the proximal CBD on ultrasound, however CT  read as no ductal dilation and LFTs normal.  Etiologies of these episodes is not clear.  Patient does endorse some early satiety symptoms, query episodes of gastroparesis  #4 adult onset diabetes mellitus #5.  Hypertension #6 hyperlipidemia #7 history of colon polyps-2 prior colonoscopies with polyps both done at [facility], by patient report the last was approximately 5 years ago and she believes she did have polyps.  Plan; Patient has signed a release and will obtain copies of her prior colonoscopies and path and then determine interval for follow-up colonoscopy  Patient will be scheduled for EGD with possible dilation with Dr. Ardis Hughs.  Procedure was discussed in detail with patient including indications risk and benefits and she is agreeable to proceed.  Start antireflux  diet and regimen Start Protonix 40 mg p.o. every morning.    Faustino Luecke S Elfreda Blanchet PA-C 07/07/2020   Cc: Shanon Rosser, PA-C

## 2020-07-08 NOTE — Progress Notes (Signed)
I agree with the above note, plan 

## 2020-08-06 ENCOUNTER — Other Ambulatory Visit: Payer: Self-pay

## 2020-08-06 ENCOUNTER — Encounter: Payer: Self-pay | Admitting: Cardiology

## 2020-08-06 ENCOUNTER — Ambulatory Visit: Payer: BC Managed Care – PPO | Admitting: Cardiology

## 2020-08-06 VITALS — BP 132/86 | HR 82 | Temp 97.5°F | Resp 16 | Ht 67.0 in | Wt 208.0 lb

## 2020-08-06 DIAGNOSIS — E782 Mixed hyperlipidemia: Secondary | ICD-10-CM | POA: Insufficient documentation

## 2020-08-06 DIAGNOSIS — I38 Endocarditis, valve unspecified: Secondary | ICD-10-CM

## 2020-08-06 DIAGNOSIS — I10 Essential (primary) hypertension: Secondary | ICD-10-CM

## 2020-08-06 NOTE — Progress Notes (Signed)
Patient referred by Shanon Rosser, PA-C for murmur  Subjective:   Terri Klein, female    DOB: [birth year], 60 y.o.   MRN: [id number]   Chief Complaint  Patient presents with  . Heart Murmur  . Hypertension  . New Patient (Initial Visit)     HPI  60 y.o. African American female with hypertension, mixed hyperlipidemia, type 2 DM, referred for evaluation of murmur  Patient works as a Sports coach, walks a lot at work.  She denies chest pain, shortness of breath, palpitations, leg edema, orthopnea, PND, TIA/syncope.  She has no family history of coronary artery disease.  Patient is a non-smoker.  She is on statin.  She was recently seen by her PCP and noted diastolic murmur on exam.   Past Medical History:  Diagnosis Date  . Diabetes mellitus without complication ([place])   . Hyperlipidemia   . Hypertension   . Kidney stone    during her 20's     Past Surgical History:  Procedure Laterality Date  . THYROIDECTOMY N/A 08/09/2018   Procedure: TOTAL THYROIDECTOMY;  Surgeon: Armandina Gemma, MD;  Location: [facility];  Service: General;  Laterality: N/A;     Social History   Tobacco Use  Smoking Status Never Smoker  Smokeless Tobacco Never Used    Social History   Substance and Sexual Activity  Alcohol Use Yes   Comment: socially     Family History  Problem Relation Age of Onset  . Cancer Mother        Cervical  . Diabetes Mother   . Hypertension Mother   . Heart disease Mother   . Hyperlipidemia Brother   . Hypertension Maternal Grandmother   . Heart disease Maternal Grandmother   . Diabetes Maternal Grandmother   . Heart disease Brother      Current Outpatient Medications on File Prior to Visit  Medication Sig Dispense Refill  . amLODipine-benazepril (LOTREL) 10-40 MG capsule Take 1 capsule by mouth daily. 90 capsule 1  . atorvastatin (LIPITOR) 20 MG tablet Take 20 mg by mouth daily.    Marland Kitchen BLACK ELDERBERRY PO Take 1 tablet by mouth every other day.    Marland Kitchen  glimepiride (AMARYL) 4 MG tablet Take 1 tablet (4 mg total) by mouth daily with breakfast. 90 tablet 1  . levothyroxine (SYNTHROID) 137 MCG tablet Take 1 tablet (137 mcg total) by mouth daily before breakfast. 90 tablet 3  . metoprolol (LOPRESSOR) 50 MG tablet Take 1 tablet (50 mg total) by mouth daily. 90 tablet 1  . multivitamin-iron-minerals-folic acid (CENTRUM) chewable tablet Chew 1 tablet by mouth daily. 90 tablet 1  . pantoprazole (PROTONIX) 40 MG tablet Take 1 tablet (40 mg total) by mouth daily before breakfast. 30 tablet 6  . sitaGLIPtin-metformin (JANUMET) 50-1000 MG tablet Take 1 tablet by mouth 2 (two) times a day.     No current facility-administered medications on file prior to visit.    Cardiovascular and other pertinent studies:  EKG 08/06/2020: SInus rhythm 84 bpm Normal EKG   Recent labs: 06/23/2020: Glucose 102, BUN/Cr 14/0.91. EGFR 79. Na/K 139/3.6. Rest of the CMP normal H/H 14/44. MCV 84. Platelets 310  HbA1C 6.1% Chol 135, TG 82, HDL 45, LDL 74 TSH 1.5 normal   Review of Systems  Cardiovascular: Negative for chest pain, dyspnea on exertion, leg swelling, palpitations and syncope.         Vitals:   08/06/20 0831  BP: 132/86  Pulse: 82  Resp: 16  Temp: Marland Kitchen)  97.5 F (36.4 C)  SpO2: 97%     Body mass index is 32.58 kg/m. Filed Weights   08/06/20 0831  Weight: 208 lb (94.3 kg)     Objective:   Physical Exam Vitals and nursing note reviewed.  Constitutional:      General: She is not in acute distress. Neck:     Vascular: No JVD.  Cardiovascular:     Rate and Rhythm: Normal rate and regular rhythm.     Heart sounds: Murmur (Very faint 1/6 early diastolic murmur.) heard.    Pulmonary:     Effort: Pulmonary effort is normal.     Breath sounds: Normal breath sounds. No wheezing or rales.  Musculoskeletal:     Right lower leg: No edema.     Left lower leg: No edema.          Assessment & Recommendations:   60 y.o. African  American female with hypertension, mixed hyperlipidemia, type 2 DM, referred for evaluation of murmur  Murmur:  Unlikely to be significant valvular abnormality.  Recommend echocardiogram.  Continue heart healthy diet and lifestyle.  She is already on a statin.  No indication for any other cardiac testing at this time.  I will see her on as-needed basis, unless significant abnormality found on echocardiogram.  Continue current antihypertensive, lipid-lowering therapy.  Thank you for referring the patient to [place]. Please feel free to contact with any questions.   Nigel Mormon, MD Pager: [phone] Office: [phone]

## 2020-08-13 ENCOUNTER — Other Ambulatory Visit: Payer: Self-pay

## 2020-08-13 ENCOUNTER — Encounter: Payer: Self-pay | Admitting: Gastroenterology

## 2020-08-13 ENCOUNTER — Ambulatory Visit (AMBULATORY_SURGERY_CENTER): Payer: BC Managed Care – PPO | Admitting: Gastroenterology

## 2020-08-13 VITALS — BP 122/74 | HR 74 | Temp 98.9°F | Resp 15 | Ht 67.0 in | Wt 208.0 lb

## 2020-08-13 DIAGNOSIS — R1310 Dysphagia, unspecified: Secondary | ICD-10-CM

## 2020-08-13 DIAGNOSIS — K219 Gastro-esophageal reflux disease without esophagitis: Secondary | ICD-10-CM

## 2020-08-13 DIAGNOSIS — K319 Disease of stomach and duodenum, unspecified: Secondary | ICD-10-CM | POA: Diagnosis not present

## 2020-08-13 DIAGNOSIS — R1084 Generalized abdominal pain: Secondary | ICD-10-CM

## 2020-08-13 DIAGNOSIS — K2970 Gastritis, unspecified, without bleeding: Secondary | ICD-10-CM

## 2020-08-13 HISTORY — PX: UPPER GASTROINTESTINAL ENDOSCOPY: SHX188

## 2020-08-13 MED ORDER — SODIUM CHLORIDE 0.9 % IV SOLN: 500.0000 mL | Freq: Once | INTRAVENOUS | Status: DC

## 2020-08-13 NOTE — Progress Notes (Signed)
Called to room to assist during endoscopic procedure.  Patient ID and intended procedure confirmed with present staff. Received instructions for my participation in the procedure from the performing physician.  

## 2020-08-13 NOTE — Progress Notes (Signed)
Vital signs checked by: Amparo Bristol  The medical and surgical history was reviewed and verified with the patient.

## 2020-08-13 NOTE — Patient Instructions (Signed)
YOU HAD AN ENDOSCOPIC PROCEDURE TODAY AT THE [facility]:   Refer to the procedure report that was given to you for any specific questions about what was found during the examination.  If the procedure report does not answer your questions, please call your gastroenterologist to clarify.  If you requested that your care partner not be given the details of your procedure findings, then the procedure report has been included in a sealed envelope for you to review at your convenience later.  YOU SHOULD EXPECT: Some feelings of bloating in the abdomen. Passage of more gas than usual.  Walking can help get rid of the air that was put into your GI tract during the procedure and reduce the bloating. If you had a lower endoscopy (such as a colonoscopy or flexible sigmoidoscopy) you may notice spotting of blood in your stool or on the toilet paper. If you underwent a bowel prep for your procedure, you may not have a normal bowel movement for a few days.  Please Note:  You might notice some irritation and congestion in your nose or some drainage.  This is from the oxygen used during your procedure.  There is no need for concern and it should clear up in a day or so.  SYMPTOMS TO REPORT IMMEDIATELY:   Following lower endoscopy (colonoscopy or flexible sigmoidoscopy):  Excessive amounts of blood in the stool  Significant tenderness or worsening of abdominal pains  Swelling of the abdomen that is new, acute  Fever of 100F or higher   Following upper endoscopy (EGD)  Vomiting of blood or coffee ground material  New chest pain or pain under the shoulder blades  Painful or persistently difficult swallowing  New shortness of breath  Fever of 100F or higher  Black, tarry-looking stools  For urgent or emergent issues, a gastroenterologist can be reached at any hour by calling [phone]. Do not use MyChart messaging for urgent concerns.    DIET:  We do recommend a small meal at first, but  then you may proceed to your regular diet.  Drink plenty of fluids but you should avoid alcoholic beverages for 24 hours.  ACTIVITY:  You should plan to take it easy for the rest of today and you should NOT DRIVE or use heavy machinery until tomorrow (because of the sedation medicines used during the test).    FOLLOW UP: Our staff will call the number listed on your records 48-72 hours following your procedure to check on you and address any questions or concerns that you may have regarding the information given to you following your procedure. If we do not reach you, we will leave a message.  We will attempt to reach you two times.  During this call, we will ask if you have developed any symptoms of COVID 19. If you develop any symptoms (ie: fever, flu-like symptoms, shortness of breath, cough etc.) before then, please call [phone].  If you test positive for Covid 19 in the 2 weeks post procedure, please call and report this information to us.    If any biopsies were taken you will be contacted by phone or by letter within the next 1-3 weeks.  Please call us at [phone] if you have not heard about the biopsies in 3 weeks.    SIGNATURES/CONFIDENTIALITY: You and/or your care partner have signed paperwork which will be entered into your electronic medical record.  These signatures attest to the fact that that the information above on   your After Visit Summary has been reviewed and is understood.  Full responsibility of the confidentiality of this discharge information lies with you and/or your care-partner. 

## 2020-08-13 NOTE — Op Note (Signed)
[facility] Patient Name: Terri Klein Procedure Date: 08/13/2020 7:38 AM MRN: [id number] Endoscopist: Milus Banister , MD Age: 60 Referring MD:  Date of Birth: Apr 14, 1960 Gender: Female Account #: [id number] Procedure:                Upper GI endoscopy Indications:              Dysphagia (started after thyroid surgery 10 years                            ago, slowly getting worse), Heartburn Medicines:                Monitored Anesthesia Care Procedure:                Pre-Anesthesia Assessment:                           - Prior to the procedure, a History and Physical                            was performed, and patient medications and                            allergies were reviewed. The patient's tolerance of                            previous anesthesia was also reviewed. The risks                            and benefits of the procedure and the sedation                            options and risks were discussed with the patient.                            All questions were answered, and informed consent                            was obtained. Prior Anticoagulants: The patient has                            taken no previous anticoagulant or antiplatelet                            agents. ASA Grade Assessment: II - A patient with                            mild systemic disease. After reviewing the risks                            and benefits, the patient was deemed in                            satisfactory condition to undergo the procedure.  After obtaining informed consent, the endoscope was                            passed under direct vision. Throughout the                            procedure, the patient's blood pressure, pulse, and                            oxygen saturations were monitored continuously. The                            Endoscope was introduced through the mouth, and                            advanced to the  second part of duodenum. The upper                            GI endoscopy was accomplished without difficulty.                            The patient tolerated the procedure well. Scope In: Scope Out: Findings:                 Minimal inflammation characterized by erythema was                            found in the gastric antrum. Biopsies were taken                            with a cold forceps for histology.                           The examined esophagus was normal. Biopsies were                            obtained from the proximal and distal esophagus                            with cold forceps for histology of suspected                            eosinophilic esophagitis.                           The exam was otherwise without abnormality. Complications:            No immediate complications. Estimated blood loss:                            None. Estimated Blood Loss:     Estimated blood loss: none. Impression:               - Mild, non-specific gastritis. Biopsied to check  for H. pylori.                           - Normal esophagus. Biopsied to check for                            Eosinophilic Esophagitis.                           - The examination was otherwise normal. Recommendation:           - Patient has a contact number available for                            emergencies. The signs and symptoms of potential                            delayed complications were discussed with the                            patient. Return to normal activities tomorrow.                            Written discharge instructions were provided to the                            patient.                           - Resume previous diet.                           - Continue present medications.                           - Await pathology results. Milus Banister, MD 08/13/2020 7:54:12 AM This report has been signed electronically.

## 2020-08-13 NOTE — Progress Notes (Signed)
Report to PACU, RN, vss, BBS= Clear.  

## 2020-08-17 ENCOUNTER — Other Ambulatory Visit: Payer: BC Managed Care – PPO

## 2020-08-17 ENCOUNTER — Telehealth: Payer: Self-pay | Admitting: *Deleted

## 2020-08-17 NOTE — Telephone Encounter (Signed)
  Follow up Call-  Call back number 08/13/2020  Post procedure Call Back phone  # [phone]  Permission to leave phone message Yes  Some recent data might be hidden     Patient questions:  Do you have a fever, pain , or abdominal swelling? No. Pain Score  0 *  Have you tolerated food without any problems? Yes.    Have you been able to return to your normal activities? Yes.    Do you have any questions about your discharge instructions: Diet   No. Medications  No. Follow up visit  No.  Do you have questions or concerns about your Care? No.  Actions: * If pain score is 4 or above: No action needed, pain <4.  1. Have you developed a fever since your procedure? no  2.   Have you had an respiratory symptoms (SOB or cough) since your procedure? no  3.   Have you tested positive for COVID 19 since your procedure no  4.   Have you had any family members/close contacts diagnosed with the COVID 19 since your procedure?  no   If yes to any of these questions please route to Joylene John, RN and Joella Prince, RN

## 2020-08-27 ENCOUNTER — Other Ambulatory Visit: Payer: BC Managed Care – PPO

## 2020-08-31 ENCOUNTER — Other Ambulatory Visit: Payer: Self-pay

## 2020-08-31 ENCOUNTER — Ambulatory Visit: Payer: BC Managed Care – PPO

## 2020-08-31 DIAGNOSIS — I38 Endocarditis, valve unspecified: Secondary | ICD-10-CM

## 2020-09-15 ENCOUNTER — Other Ambulatory Visit: Payer: Self-pay

## 2020-09-15 ENCOUNTER — Ambulatory Visit: Payer: BC Managed Care – PPO | Admitting: Internal Medicine

## 2020-09-15 ENCOUNTER — Encounter: Payer: Self-pay | Admitting: Internal Medicine

## 2020-09-15 VITALS — BP 130/76 | HR 79 | Ht 67.0 in | Wt 206.0 lb

## 2020-09-15 DIAGNOSIS — C73 Malignant neoplasm of thyroid gland: Secondary | ICD-10-CM

## 2020-09-15 DIAGNOSIS — E890 Postprocedural hypothyroidism: Secondary | ICD-10-CM

## 2020-09-15 LAB — T4, FREE: Free T4: 1.14 ng/dL (ref 0.60–1.60)

## 2020-09-15 LAB — TSH: TSH: 0.98 u[IU]/mL (ref 0.35–5.50)

## 2020-09-15 NOTE — Progress Notes (Signed)
Patient ID: Terri Klein, female   DOB: 1961/01/29, 60 y.o.   MRN: [id number]   This visit occurred during the SARS-CoV-2 public health emergency.  Safety protocols were in place, including screening questions prior to the visit, additional usage of staff PPE, and extensive cleaning of exam room while observing appropriate contact time as indicated for disinfecting solutions.   HPI  Terri Klein is a 60 y.o.-year-old female, initially referred by Dr. Harlow Asa, presenting for follow-up for thyroid cancer and postsurgical hypothyroidism.  Last visit 8 months ago. She moved to the area from [place] in 2015.  Last visit with me 3 months ago.  Interim history: She lost 6 lbs since last OV. She saw cardiology for a heart murmur >> benign. She saw GI >> acid reflux >> started Protonix 2 mo ago. Denies fatigue.  Thyroid cancer: Reviewed and addended her thyroid cancer history: She was diagnosed with thyroid cancer after she found a neck mass in 60 by looking in the mirror.  Retrospectively, she also had some dysphagia.  05/08/2018: Thyroid ultrasound showed several nodules of which 2 were biopsied  05/28/2018: FNA: Right inferior 1.5 cm; Othrer 2 dimensions: 1.4 x 1.1cm, Solid / almost completeley solid, Hypoechoic, TI-RADS total points 4  >> benign Isthmus mid 2.1cm; Other 2 dimensions: 1.7 x 1.2cm, Solid / almost completely solid, Hypoechoic, TI-RADS total points 4 >> ATYPIA OF UNDETERMINED SIGNIFICANCE OR FOLLICULAR LESION OF UNDETERMINED SIGNIFICANCE ([place] CATEGORY III) >> Afirma: suspicious (malignancy risk 50%)  08/09/2018: Total thyroidectomy: Diagnosis Thyroid, thyroidectomy, Total - PAPILLARY THYROID CARCINOMA, FOLLICULAR VARIANT, 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 - CARCINOMA IS CONFINED TO THE THYROID. - FOLLICULAR ADENOMAS. - ONE BENIGN LYMPH NODE (0/1). - SEE ONCOLOGY TABLE BELOW. Microscopic Comment THYROID GLAND: Procedure: Thyroidectomy. Tumor Focality: Unifocal. Tumor Site:  Isthmus. Tumor Size: 2.2 cm, gross measurement. Histologic Type: Papillary thyroid carcinoma, follicular variant. Margins: Negative for carcinoma. Angioinvasion: Not identified. Lymphatic Invasion: Not identified. Extrathyroidal: Not identified. Regional Lymph Nodes: Number of Lymph Nodes Involved: 0 of 1. Pathologic Stage Classification (pTNM, AJCC 8th Edition): pT2, pN0.  She did not have RAI treatment.  Neck U/S (10/07/2019):  Status post total thyroidectomy. No thyroid bed abnormality by Ultrasound. No regional adenopathy.  Review latest thyroglobulin and ATA antibody levels: Lab Results  Component Value Date   THYROGLB <0.1 (L) 09/19/2019   THYROGLB 0.3 (L) 09/11/2018   Lab Results  Component Value Date   THGAB <1 09/19/2019   THGAB <1 09/11/2018   Postsurgical hypothyroidism:  Pt is on levothyroxine 137 mcg daily, taken: - in am (takes it at 4 am, goes to work at 6 am) - fasting - at least 30 min from b'fast - no Ca, Fe - + PPIs 2.5-3h after LT4 (started 2 mo ago) - + MVI's 3.5 to 4 hours after levothyroxine - not on Biotin  Reviewed her TFTs: Lab Results  Component Value Date   TSH 1.83 09/19/2019   TSH 1.43 01/30/2019   TSH 1.96 12/06/2018   TSH 7.49 (H) 10/25/2018   TSH 12.96 (H) 09/11/2018   FREET4 1.17 09/19/2019   FREET4 1.13 01/30/2019   FREET4 1.24 12/06/2018   FREET4 0.97 10/25/2018   FREET4 0.74 09/11/2018    She has a history of 2 parathyroid glands resected >30 years ago for hyperparathyroidism causing her kidney stones.  Calcium normalized afterwards..  Postsurgical hypocalcemia: Of note, her calcium postop was low, at 7.9 but normalized afterwards.  She stopped Tums at the indication of Dr. Harlow Asa in 08/27/2018.  Latest calcium level was normal: Lab Results  Component Value Date   CALCIUM 9.0 06/18/2020   CALCIUM 9.5 05/17/2020   CALCIUM 9.2 09/11/2018   CALCIUM 7.8 (L) 08/10/2018   CALCIUM 9.4 08/02/2018   CALCIUM 9.3 12/23/2015    She denies perioral numbness and cramps in her hands.  Latest vitamin D level was normal: Lab Results  Component Value Date   VD25OH 53.9 09/19/2019   VD25OH 41.70 09/11/2018   She was on ergocalciferol 50,000 units weekly >> off since 2015.  Pt denies: - feeling nodules in neck - hoarseness - dysphagia - choking - SOB with lying down  No FH of thyroid cancer. No h/o radiation tx to head or neck.  No herbal supplements. No Biotin use. No recent steroids use.   She also has a h/o type 2 diabetes.  Latest HbA1c: Lab Results  Component Value Date   HGBA1C 6.3 (H) 08/02/2018   ROS: Constitutional: no weight gain/no weight loss, no fatigue, no subjective hyperthermia, no subjective hypothermia, + hot flashes, + difficulty sleeping Eyes: no blurry vision, no xerophthalmia ENT: no sore throat, + see HPI Cardiovascular: no CP/no SOB/no palpitations/no leg swelling Respiratory: no cough/no SOB/no wheezing Gastrointestinal: no N/no V/no D/no C/no acid reflux Musculoskeletal: no muscle aches/no joint aches Skin: no rashes, no hair loss Neurological: no tremors/no numbness/no tingling/no dizziness  I reviewed pt's medications, allergies, PMH, social hx, family hx, and changes were documented in the history of present illness. Otherwise, unchanged from my initial visit note.  Past Medical History:  Diagnosis Date   Diabetes mellitus without complication ([place])    Hyperlipidemia    Hypertension    Kidney stone    during her 20's   Past Surgical History:  Procedure Laterality Date   THYROIDECTOMY N/A 08/09/2018   Procedure: TOTAL THYROIDECTOMY;  Surgeon: Armandina Gemma, MD;  Location: [facility];  Service: General;  Laterality: N/A;   UPPER GASTROINTESTINAL ENDOSCOPY  08/13/2020   D. Jacobs,MD [facility]   Social History   Socioeconomic History   Marital status: Single    Spouse name: Not on file   Number of children: 2   Years of education: Not on file   Highest education level:  Not on file  Occupational History   Occupation: custodian  Scientist, product/process development strain: Not on file   Food insecurity    Worry: Not on file    Inability: Not on file   Transportation needs    Medical: Not on file    Non-medical: Not on file  Tobacco Use   Smoking status: Never Smoker   Smokeless tobacco: Never Used  Substance and Sexual Activity   Alcohol use: Yes    Comment: socially, beer   Drug use: No   Sexual activity: Yes    Birth control/protection: Condom   Current Outpatient Medications on File Prior to Visit  Medication Sig Dispense Refill   amLODipine-benazepril (LOTREL) 10-40 MG capsule Take 1 capsule by mouth daily. 90 capsule 1   atorvastatin (LIPITOR) 20 MG tablet Take 20 mg by mouth daily.     BLACK ELDERBERRY PO Take 1 tablet by mouth every other day.     glimepiride (AMARYL) 4 MG tablet Take 1 tablet (4 mg total) by mouth daily with breakfast. 90 tablet 1   levothyroxine (SYNTHROID) 137 MCG tablet Take 1 tablet (137 mcg total) by mouth daily before breakfast. 90 tablet 3   metoprolol (LOPRESSOR) 50 MG tablet Take 1 tablet (50 mg  total) by mouth daily. 90 tablet 1   multivitamin-iron-minerals-folic acid (CENTRUM) chewable tablet Chew 1 tablet by mouth daily. 90 tablet 1   pantoprazole (PROTONIX) 40 MG tablet Take 1 tablet (40 mg total) by mouth daily before breakfast. 30 tablet 6   sitaGLIPtin-metformin (JANUMET) 50-1000 MG tablet Take 1 tablet by mouth 2 (two) times a day.     No current facility-administered medications on file prior to visit.   Allergies  Allergen Reactions   Iodine     immobilized   Family History  Problem Relation Age of Onset   Cancer Mother        Cervical   Diabetes Mother    Hypertension Mother    Heart disease Mother    Hyperlipidemia Brother    Hypertension Maternal Grandmother    Heart disease Maternal Grandmother    Diabetes Maternal Grandmother    Heart disease Brother    Colon cancer Neg Hx     Esophageal cancer Neg Hx    Stomach cancer Neg Hx    Rectal cancer Neg Hx    PE: BP 130/76 (BP Location: Right Arm, Patient Position: Sitting, Cuff Size: Normal)   Pulse 79   Ht 5' 7"  (1.702 m)   Wt 206 lb (93.4 kg)   LMP 04/20/2008   SpO2 99%   BMI 32.26 kg/m  Wt Readings from Last 3 Encounters:  09/15/20 206 lb (93.4 kg)  08/13/20 208 lb (94.3 kg)  08/06/20 208 lb (94.3 kg)   Constitutional: overweight, in NAD Eyes: PERRLA, EOMI, no exophthalmos ENT: moist mucous membranes, no neck masses palpable, no cervical lymphadenopathy Cardiovascular: RRR, No MRG Respiratory: CTA B Gastrointestinal: abdomen soft, NT, ND, BS+ Musculoskeletal: no deformities, strength intact in all 4 Skin: moist, warm, no rashes Neurological: no tremor with outstretched hands, DTR normal in all 4  ASSESSMENT: 1. Thyroid cancer - see HPI  2. Postsurgical Hypothyroidism  3. Postsurgical hypocalcemia  PLAN:  1. 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  PTC is a slow-growing cancer, with good prognosis.  RAI treatment was not indicated for her. -We will continue to follow her by checking thyroglobulin levels and also neck U/S's as needed -At last visit, she complained of problems swallowing and a lump in her lower neck/chest.  We reviewed together the results of her neck ultrasound obtained on 10/07/2019 and there were no recurrences or suspicious masses in the neck. -At last visit, we checked her ATA and thyroglobulin and they were both undetectable, which is excellent -I will see her back in 1 year  2. Patient with history of total thyroidectomy for cancer, now with iatrogenic hypothyroidism, on levothyroxine. - latest thyroid labs reviewed with pt. >> normal: Lab Results  Component Value Date   TSH  1.83 09/19/2019  - she continues on LT4 137 mcg daily - pt feels good on this dose. Lost 6 lbs since last OV. - we discussed about taking the thyroid hormone every day, with water, >30 minutes before breakfast, separated by >4 hours from acid reflux medications, calcium, iron, multivitamins. Pt. was taking it correctly, however, since then, she had improved approximately 2 months ago when she is taking this approximately 1.5 to 2 hours after levothyroxine.  I advised her to try to move it approximately 4 hours later. - will check  thyroid tests today: TSH and fT4 - If labs are abnormal, she will need to return for repeat TFTs in 1.5 months  3. Postsurgical hypocalcemia -Previously on ergocalciferol, now off -At last visit, she had a normal vitamin D level (09/19/2019).  The calcium level was normal at last check (06/18/2020). -No symptoms of hypocalcemia at today's visit  Needs refills.  Component     Latest Ref Rng & Units 09/15/2020  Thyroglobulin     ng/mL <0.1 (L)  Comment        TSH     0.35 - 5.50 uIU/mL 0.98  T4,Free(Direct)     0.60 - 1.60 ng/dL 1.14  Thyroglobulin Ab     < or = 1 IU/mL <1  Labs are at goal including thyroglobulin, which is undetectable.  Philemon Kingdom, MD PhD [facility]

## 2020-09-15 NOTE — Patient Instructions (Addendum)
Please continue Levothyroxine 137 mcg daily.  Take the thyroid hormone every day, with water, at least 30 minutes before breakfast, separated by at least 4 hours from: - acid reflux medications - calcium - iron - multivitamins  Please move Protonix to ~8 am.  Please stop at the lab.  Please come back for a follow-up appointment in 1 year.

## 2020-09-16 LAB — THYROGLOBULIN LEVEL: Thyroglobulin: <0.1 ng/mL — ABNORMAL LOW

## 2020-09-16 LAB — THYROGLOBULIN ANTIBODY: Thyroglobulin Ab: <1 IU/mL (ref <=1)

## 2020-09-16 MED ORDER — LEVOTHYROXINE SODIUM 137 MCG PO TABS: 137.0000 ug | ORAL_TABLET | Freq: Every day | ORAL | 3 refills | Status: DC

## 2021-03-23 ENCOUNTER — Encounter: Payer: Self-pay | Admitting: Podiatry

## 2021-03-23 ENCOUNTER — Other Ambulatory Visit: Payer: Self-pay

## 2021-03-23 ENCOUNTER — Ambulatory Visit: Payer: BC Managed Care – PPO | Admitting: Podiatry

## 2021-03-23 DIAGNOSIS — E119 Type 2 diabetes mellitus without complications: Secondary | ICD-10-CM

## 2021-03-23 NOTE — Progress Notes (Signed)
°  Subjective:  Patient ID: Terri Klein, female    DOB: 13-May-1960,   MRN: [id number]  Chief Complaint  Patient presents with   debride    DFC -FBS: 100 a1C: 5.6 PCP: Scott x Sunday     61  y.o. female presents for diabetic foot check. Denies any pain in her feet. Denies burning or tingling. Last A1c was 5.6  . Denies any other pedal complaints. Denies n/v/f/c.   PCP: Shanon Rosser MD   Past Medical History:  Diagnosis Date   Diabetes mellitus without complication ([place])    Hyperlipidemia    Hypertension    Kidney stone    during her 20's    Objective:  Physical Exam: Vascular: DP/PT pulses 2/4 bilateral. CFT <3 seconds. Normal hair growth on digits. No edema.  Skin. No lacerations or abrasions bilateral feet. Nails elongated but normal in appearance.  Musculoskeletal: MMT 5/5 bilateral lower extremities in DF, PF, Inversion and Eversion. Deceased ROM in DF of ankle joint.  Neurological: Sensation intact to light touch.   Assessment:  No diagnosis found.   Plan:  Patient was evaluated and treated and all questions answered. -Discussed and educated patient on diabetic foot care, especially with  regards to the vascular, neurological and musculoskeletal systems.  -Stressed the importance of good glycemic control and the detriment of not  controlling glucose levels in relation to the foot. -Discussed supportive shoes at all times and checking feet regularly.  -Mechanically debrided all nails 1-5 bilateral using sterile nail nipper and filed with dremel without incident  as courtesy.  -Answered all patient questions -Patient to return  in one year for diabetic foot exam.  -Patient advised to call the office if any problems or questions arise in the meantime.   Lorenda Peck, DPM

## 2021-05-02 ENCOUNTER — Encounter: Payer: Self-pay | Admitting: Internal Medicine

## 2021-07-19 ENCOUNTER — Emergency Department (HOSPITAL_COMMUNITY)
Admission: EM | Admit: 2021-07-19 | Discharge: 2021-07-19 | Disposition: A | Discharge status: Home / Self Care | Payer: BC Managed Care – PPO | Attending: Emergency Medicine | Admitting: Emergency Medicine

## 2021-07-19 ENCOUNTER — Encounter (HOSPITAL_COMMUNITY): Payer: Self-pay

## 2021-07-19 ENCOUNTER — Other Ambulatory Visit: Payer: Self-pay

## 2021-07-19 ENCOUNTER — Emergency Department (HOSPITAL_COMMUNITY): Payer: BC Managed Care – PPO

## 2021-07-19 DIAGNOSIS — Z7984 Long term (current) use of oral hypoglycemic drugs: Secondary | ICD-10-CM | POA: Diagnosis not present

## 2021-07-19 DIAGNOSIS — I10 Essential (primary) hypertension: Secondary | ICD-10-CM | POA: Insufficient documentation

## 2021-07-19 DIAGNOSIS — E119 Type 2 diabetes mellitus without complications: Secondary | ICD-10-CM | POA: Diagnosis not present

## 2021-07-19 DIAGNOSIS — R112 Nausea with vomiting, unspecified: Secondary | ICD-10-CM

## 2021-07-19 DIAGNOSIS — K209 Esophagitis, unspecified without bleeding: Secondary | ICD-10-CM | POA: Insufficient documentation

## 2021-07-19 DIAGNOSIS — Z79899 Other long term (current) drug therapy: Secondary | ICD-10-CM | POA: Diagnosis not present

## 2021-07-19 LAB — COMPREHENSIVE METABOLIC PANEL
ALT: 21 U/L (ref 0–44)
AST: 21 U/L (ref 15–41)
Albumin: 4.2 g/dL (ref 3.5–5.0)
Alkaline Phosphatase: 49 U/L (ref 38–126)
Anion gap: 15 (ref 5–15)
BUN: 19 mg/dL (ref 8–23)
CO2: 26 mmol/L (ref 22–32)
Calcium: 9.1 mg/dL (ref 8.9–10.3)
Chloride: 96 mmol/L — ABNORMAL LOW (ref 98–111)
Creatinine, Ser: 1.21 mg/dL — ABNORMAL HIGH (ref 0.44–1.00)
GFR, Estimated: 51 mL/min — ABNORMAL LOW (ref >60)
Glucose, Bld: 171 mg/dL — ABNORMAL HIGH (ref 70–99)
Potassium: 3 mmol/L — ABNORMAL LOW (ref 3.5–5.1)
Sodium: 137 mmol/L (ref 135–145)
Total Bilirubin: 0.8 mg/dL (ref 0.3–1.2)
Total Protein: 7.7 g/dL (ref 6.5–8.1)

## 2021-07-19 LAB — CBC
HCT: 43.6 % (ref 36.0–46.0)
Hemoglobin: 14.5 g/dL (ref 12.0–15.0)
MCH: 26.8 pg (ref 26.0–34.0)
MCHC: 33.3 g/dL (ref 30.0–36.0)
MCV: 80.6 fL (ref 80.0–100.0)
Platelets: 313 10*3/uL (ref 150–400)
RBC: 5.41 MIL/uL — ABNORMAL HIGH (ref 3.87–5.11)
RDW: 14.7 % (ref 11.5–15.5)
WBC: 11.5 10*3/uL — ABNORMAL HIGH (ref 4.0–10.5)
nRBC: 0 % (ref 0.0–0.2)

## 2021-07-19 LAB — URINALYSIS, ROUTINE W REFLEX MICROSCOPIC
Bilirubin Urine: NEGATIVE
Glucose, UA: 50 mg/dL — AB
Hgb urine dipstick: NEGATIVE
Ketones, ur: 20 mg/dL — AB
Nitrite: NEGATIVE
Protein, ur: NEGATIVE mg/dL
Specific Gravity, Urine: 1.015 (ref 1.005–1.030)
pH: 7 (ref 5.0–8.0)

## 2021-07-19 LAB — MAGNESIUM: Magnesium: 1.5 mg/dL — ABNORMAL LOW (ref 1.7–2.4)

## 2021-07-19 LAB — LIPASE, BLOOD: Lipase: 24 U/L (ref 11–51)

## 2021-07-19 MED ORDER — MORPHINE SULFATE (PF) 4 MG/ML IV SOLN
4.0000 mg | Freq: Once | INTRAVENOUS | Status: AC
  Administered 2021-07-19: 4 mg via INTRAVENOUS
  Filled 2021-07-19: qty 1

## 2021-07-19 MED ORDER — SUCRALFATE 1 G PO TABS: 1.0000 g | ORAL_TABLET | Freq: Three times a day (TID) | ORAL | 0 refills | Status: DC

## 2021-07-19 MED ORDER — SODIUM CHLORIDE 0.9 % IV BOLUS
1000.0000 mL | Freq: Once | INTRAVENOUS | Status: AC
  Administered 2021-07-19: 1000 mL via INTRAVENOUS

## 2021-07-19 MED ORDER — LIDOCAINE VISCOUS HCL 2 % MT SOLN: 15.0000 mL | OROMUCOSAL | 0 refills | Status: DC | PRN

## 2021-07-19 MED ORDER — SUCRALFATE 1 G PO TABS: 1.0000 g | ORAL_TABLET | Freq: Once | ORAL | Status: DC

## 2021-07-19 MED ORDER — ONDANSETRON HCL 4 MG PO TABS: 4.0000 mg | ORAL_TABLET | Freq: Four times a day (QID) | ORAL | 0 refills | Status: DC

## 2021-07-19 MED ORDER — PANTOPRAZOLE SODIUM 40 MG IV SOLR
40.0000 mg | Freq: Once | INTRAVENOUS | Status: AC
  Administered 2021-07-19: 40 mg via INTRAVENOUS
  Filled 2021-07-19: qty 10

## 2021-07-19 MED ORDER — ONDANSETRON HCL 4 MG/2ML IJ SOLN
4.0000 mg | Freq: Once | INTRAMUSCULAR | Status: AC
Start: 2021-07-19 — End: 2021-07-19
  Administered 2021-07-19: 4 mg via INTRAVENOUS
  Filled 2021-07-19: qty 2

## 2021-07-19 MED ORDER — POTASSIUM CHLORIDE 10 MEQ/100ML IV SOLN
10.0000 meq | INTRAVENOUS | Status: AC
  Administered 2021-07-19 (×2): 10 meq via INTRAVENOUS
  Filled 2021-07-19 (×2): qty 100

## 2021-07-19 MED ORDER — IOHEXOL 300 MG/ML  SOLN
100.0000 mL | Freq: Once | INTRAMUSCULAR | Status: AC | PRN
  Administered 2021-07-19: 100 mL via INTRAVENOUS

## 2021-07-19 MED ORDER — MAGNESIUM SULFATE 2 GM/50ML IV SOLN
2.0000 g | Freq: Once | INTRAVENOUS | Status: AC
  Administered 2021-07-19: 2 g via INTRAVENOUS
  Filled 2021-07-19: qty 50

## 2021-07-19 MED ORDER — ALUM & MAG HYDROXIDE-SIMETH 200-200-20 MG/5ML PO SUSP
30.0000 mL | Freq: Once | ORAL | Status: AC
  Administered 2021-07-19: 30 mL via ORAL
  Filled 2021-07-19: qty 30

## 2021-07-19 MED ORDER — LIDOCAINE VISCOUS HCL 2 % MT SOLN
15.0000 mL | Freq: Once | OROMUCOSAL | Status: AC
  Administered 2021-07-19: 15 mL via ORAL
  Filled 2021-07-19: qty 15

## 2021-07-19 MED ORDER — PANTOPRAZOLE SODIUM 40 MG PO TBEC: 40.0000 mg | DELAYED_RELEASE_TABLET | Freq: Every day | ORAL | 1 refills | Status: DC

## 2021-07-19 NOTE — ED Notes (Signed)
Pt in CT.

## 2021-07-19 NOTE — ED Triage Notes (Signed)
Pt. Stated, Terri Klein been sick since Saturday with stomach pain, N/V ?

## 2021-07-19 NOTE — ED Provider Notes (Signed)
?[facility] ?Provider Note ? ? ?CSN: [id number] ?Arrival date & time: 07/19/21  0908 ? ?  ? ?History ? ?Chief Complaint  ?Patient presents with  ? Abdominal Pain  ? Nausea  ? Emesis  ? ? ?Terri Klein is a 61 y.o. female with a PMH significant for DM2, HTN, HLD who presents with stomach pain, nausea, vomiting without significant diarrhea for the last 3-4 days. Daughter reports patient had some mussels and pasta on Saturday which they think may have caused an acute food poisoning, patient with some improvement last night, before worsening again after trying to eat a peanut butter sandwich. Patient denies dysuria, hematuria. She does endorse fever and chills.  She is postmenopausal, denies any vaginal bleeding associated with this.  She reports that she was seen with similar symptoms around a year plus ago, diagnosed with some kind of bacterial infection of the stomach, had an EGD performed with no evidence of ulcer or H. pylori infection.  Patient reports her last A1c was normal, but she has not been checking her sugars over the last few days.  Denies chest pain, shortness of breath, diaphoresis, hematemesis. Does not take any blood thinners. ? ? ?Abdominal Pain ?Associated symptoms: vomiting   ?Emesis ?Associated symptoms: abdominal pain   ? ?  ? ?Home Medications ?Prior to Admission medications   ?Medication Sig Start Date End Date Taking? Authorizing Provider  ?lidocaine (XYLOCAINE) 2 % solution Use as directed 15 mLs in the mouth or throat as needed. 07/19/21  Yes Rafeef Lau H, PA-C  ?ondansetron (ZOFRAN) 4 MG tablet Take 1 tablet (4 mg total) by mouth every 6 (six) hours. 07/19/21  Yes Gertude Benito H, PA-C  ?sucralfate (CARAFATE) 1 g tablet Take 1 tablet (1 g total) by mouth 4 (four) times daily -  with meals and at bedtime. 07/19/21  Yes Nialah Saravia H, PA-C  ?amLODipine-benazepril (LOTREL) 10-40 MG capsule Take 1 capsule by mouth daily. 12/24/15   Mignon Pine, DO  ?atorvastatin (LIPITOR) 20 MG tablet Take 20 mg by mouth daily.    [provider]  ?BLACK ELDERBERRY PO Take 1 tablet by mouth every other day.    [provider]  ?glimepiride (AMARYL) 4 MG tablet Take 1 tablet (4 mg total) by mouth daily with breakfast. 12/24/15   Mignon Pine, DO  ?levothyroxine (SYNTHROID) 137 MCG tablet Take 1 tablet (137 mcg total) by mouth daily before breakfast. 09/16/20   Philemon Kingdom, MD  ?metoprolol (LOPRESSOR) 50 MG tablet Take 1 tablet (50 mg total) by mouth daily. 12/24/15 09/19/19  Mignon Pine, DO  ?multivitamin-iron-minerals-folic acid (CENTRUM) chewable tablet Chew 1 tablet by mouth daily. 12/24/15   Mignon Pine, DO  ?pantoprazole (PROTONIX) 40 MG tablet Take 1 tablet (40 mg total) by mouth daily before breakfast. 07/19/21   Kieffer Blatz H, PA-C  ?sitaGLIPtin-metformin (JANUMET) 50-1000 MG tablet Take 1 tablet by mouth 2 (two) times a day.    [provider]  ?   ? ?Allergies    ?Iodine   ? ?Review of Systems   ?Review of Systems  ?Gastrointestinal:  Positive for abdominal pain and vomiting.  ?All other systems reviewed and are negative. ? ?Physical Exam ?Updated Vital Signs ?BP (!) 146/66   Pulse 80   Temp 98.4 ?F (36.9 ?C) (Oral)   Resp 14   LMP 04/20/2008   SpO2 96%  ?Physical Exam ?Vitals and nursing note reviewed.  ?Constitutional:   ?  General: She is not in acute distress. ?   Appearance: Normal appearance. She is ill-appearing.  ?   Comments: Patient lying in right lateral decubitus, actively vomiting on my evaluation  ?HENT:  ?   Head: Normocephalic and atraumatic.  ?   Mouth/Throat:  ?   Mouth: Mucous membranes are dry.  ?Eyes:  ?   General:     ?   Right eye: No discharge.     ?   Left eye: No discharge.  ?Cardiovascular:  ?   Rate and Rhythm: Normal rate and regular rhythm.  ?   Heart sounds: No murmur heard. ?  No friction rub. No gallop.  ?Pulmonary:  ?   Effort: Pulmonary effort is normal. No  respiratory distress.  ?   Breath sounds: Normal breath sounds.  ?Abdominal:  ?   General: Bowel sounds are normal.  ?   Palpations: Abdomen is soft.  ?   Comments: Tenderness to palpation most focally in the epigastric region, but present throughout the entire abdomen.  No rebound, rigidity, guarding, somewhat decreased bowel sounds but bowel sounds still present.  No distention noted, no ecchymosis noted.  ?Musculoskeletal:     ?   General: No deformity.  ?Skin: ?   General: Skin is warm and dry.  ?   Capillary Refill: Capillary refill takes less than 2 seconds.  ?Neurological:  ?   Mental Status: She is alert and oriented to person, place, and time.  ?Psychiatric:     ?   Mood and Affect: Mood normal.     ?   Behavior: Behavior normal.  ? ? ?ED Results / Procedures / Treatments   ?Labs ?(all labs ordered are listed, but only abnormal results are displayed) ?Labs Reviewed  ?COMPREHENSIVE METABOLIC PANEL - Abnormal; Notable for the following components:  ?    Result Value  ? Potassium 3.0 (*)   ? Chloride 96 (*)   ? Glucose, Bld 171 (*)   ? Creatinine, Ser 1.21 (*)   ? GFR, Estimated 51 (*)   ? All other components within normal limits  ?CBC - Abnormal; Notable for the following components:  ? WBC 11.5 (*)   ? RBC 5.41 (*)   ? All other components within normal limits  ?URINALYSIS, ROUTINE W REFLEX MICROSCOPIC - Abnormal; Notable for the following components:  ? APPearance HAZY (*)   ? Glucose, UA 50 (*)   ? Ketones, ur 20 (*)   ? Leukocytes,Ua TRACE (*)   ? Bacteria, UA RARE (*)   ? All other components within normal limits  ?MAGNESIUM - Abnormal; Notable for the following components:  ? Magnesium 1.5 (*)   ? All other components within normal limits  ?LIPASE, BLOOD  ? ? ?EKG ?None ? ?Radiology ?CT ABDOMEN PELVIS W CONTRAST ? ?Result Date: 07/19/2021 ?CLINICAL DATA:  Abdominal pain. EXAM: CT ABDOMEN AND PELVIS WITH CONTRAST TECHNIQUE: Multidetector CT imaging of the abdomen and pelvis was performed using the  standard protocol following bolus administration of intravenous contrast. RADIATION DOSE REDUCTION: This exam was performed according to the departmental dose-optimization program which includes automated exposure control, adjustment of the mA and/or kV according to patient size and/or use of iterative reconstruction technique. CONTRAST:  161m OMNIPAQUE IOHEXOL 300 MG/ML  SOLN COMPARISON:  June 18, 2020 CT abdomen pelvis. FINDINGS: Lower chest: No acute abnormality. Tiny pericardial effusion is unchanged within physiologic normal limits. New symmetric distal esophageal wall thickening. Hepatobiliary: No suspicious hepatic lesion. Gallbladder is unremarkable.  No biliary ductal dilation. Pancreas: No pancreatic ductal dilation or evidence of acute inflammation. Spleen: No splenomegaly or focal splenic lesion. Adrenals/Urinary Tract: Bilateral adrenal glands appear normal. No hydronephrosis. Punctate nonobstructive right lower pole renal calculus. Right lower pole cortical renal scarring. Hypodense 8 mm right upper pole renal lesion on image 21/4 is technically too small to accurately characterize but statistically likely reflect a cyst, which in the absence of clinically indicated signs/symptoms require no independent follow-up. Kidneys demonstrate symmetric enhancement and excretion of contrast material. Urinary bladder is unremarkable for degree of distension. Stomach/Bowel: No radiopaque enteric contrast material was administered. Stomach is minimally distended limiting evaluation. No pathologic dilation of small or large bowel. The terminal ileum and appendix appear normal. A few scattered colonic diverticula without findings of acute diverticulitis. No evidence of acute bowel inflammation. Vascular/Lymphatic: Scattered aortic and branch vessel atherosclerosis without abdominal aortic aneurysm. No pathologically enlarged abdominal or pelvic lymph nodes. Reproductive: Leiomyomatous uterus.  No suspicious adnexal  mass. Other: No significant abdominopelvic free fluid. Musculoskeletal: Mild levoconvex curvature of the thoracolumbar spine possibly positional. Multilevel degenerative changes spine. Chronic changes of the pubic sym

## 2021-07-19 NOTE — ED Notes (Signed)
Pt. Staes the medicine under her tongue makes her throw up. ?

## 2021-07-19 NOTE — Discharge Instructions (Addendum)
Please use Tylenol for pain, as well as the viscous lidocaine, Carafate, and over-the-counter Maalox.  Please follow-up with gastroenterology as soon as you can.  I recommend that you rest, drink plenty of fluids, stick to a bland diet until you are feeling back to your normal self. ?

## 2021-07-19 NOTE — ED Notes (Signed)
Pt.'s daughter states that Ms. Headings is allergic to iodine and when she was admitted a couple weeks ago it was r/t to a shellfish allergy. She said she ate some shellfish and started having the same abdominal pain that she's having now.  ?

## 2021-08-15 ENCOUNTER — Ambulatory Visit: Payer: BC Managed Care – PPO | Admitting: Physician Assistant

## 2021-08-15 ENCOUNTER — Encounter: Payer: Self-pay | Admitting: Physician Assistant

## 2021-08-15 VITALS — BP 130/76 | HR 95 | Ht 67.0 in | Wt 197.0 lb

## 2021-08-15 DIAGNOSIS — R1310 Dysphagia, unspecified: Secondary | ICD-10-CM

## 2021-08-15 DIAGNOSIS — K219 Gastro-esophageal reflux disease without esophagitis: Secondary | ICD-10-CM

## 2021-08-15 DIAGNOSIS — K209 Esophagitis, unspecified without bleeding: Secondary | ICD-10-CM | POA: Diagnosis not present

## 2021-08-15 MED ORDER — PANTOPRAZOLE SODIUM 20 MG PO TBEC: 20.0000 mg | DELAYED_RELEASE_TABLET | Freq: Every day | ORAL | 11 refills | Status: DC

## 2021-08-15 NOTE — Progress Notes (Signed)
Subjective:    Patient ID: Terri Klein, female    DOB: 04-28-1960, 61 y.o.   MRN: [id number]  HPI Terri Klein is a pleasant 61 year old African-American female established with Dr. Ardis Hughs.  She comes in today after recent ER visit on 07/19/2021 after a 3 to 4-day episode of nausea /vomiting and diarrhea. She had CT of the abdomen and pelvis done which showed a tiny pericardial effusion with no change, esophageal wall thickening consistent with esophagitis, and a leiomatous uterus.  She was given a 2-week course of Protonix 40 mg and viscous Xylocaine. She has not been on any chronic PPI therapy. She says that she thinks the episode that took her to the emergency room was food poisoning.  She had eaten seafood within 2 days of onset of symptoms and says that this has occurred in the past with seafood as well.  She is also being referred for allergy testing. Currently she is feeling fine, has no complaints of abdominal pain, no heartburn or indigestion.  She says she does have occasional mild dysphagia with larger pieces of food and foods like stuffing and says she has learned to chew more carefully and cut her food into smaller pieces. She did undergo EGD last summer June 2022 which showed antral gastritis, normal-appearing esophagus.  Biopsies showed reactive gastropathy, no H. pylori and esophageal biopsies were consistent with reflux changes. She has not had prior colonoscopy.  She says she did do a Cologuard earlier this year which was negative through her PCP.  No family history of colon cancer. 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  Review of Systems Pertinent positive and negative review of systems were noted in the above HPI section.  All other review of systems was otherwise negative.   Outpatient Encounter Medications as of 08/15/2021  Medication Sig   amLODipine-benazepril  (LOTREL) 10-40 MG capsule Take 1 capsule by mouth daily.   atorvastatin (LIPITOR) 20 MG tablet Take 20 mg by mouth daily.   BLACK ELDERBERRY PO Take 1 tablet by mouth every other day.   glimepiride (AMARYL) 4 MG tablet Take 1 tablet (4 mg total) by mouth daily with breakfast.   levothyroxine (SYNTHROID) 137 MCG tablet Take 1 tablet (137 mcg total) by mouth daily before breakfast.   multivitamin-iron-minerals-folic acid (CENTRUM) chewable tablet Chew 1 tablet by mouth daily.   ondansetron (ZOFRAN) 4 MG tablet Take 1 tablet (4 mg total) by mouth every 6 (six) hours.   pantoprazole (PROTONIX) 20 MG tablet Take 1 tablet (20 mg total) by mouth daily before breakfast.   sitaGLIPtin-metformin (JANUMET) 50-1000 MG tablet Take 1 tablet by mouth 2 (two) times a day.   metoprolol (LOPRESSOR) 50 MG tablet Take 1 tablet (50 mg total) by mouth daily.   [DISCONTINUED] lidocaine (XYLOCAINE) 2 % solution Use as directed 15 mLs in the mouth or throat as needed.   [DISCONTINUED] pantoprazole (PROTONIX) 40 MG tablet Take 1 tablet (40 mg total) by mouth daily before breakfast.   [DISCONTINUED] sucralfate (CARAFATE) 1 g tablet Take 1 tablet (1 g total) by mouth 4 (four) times daily -  with meals and at bedtime.   No facility-administered encounter medications on file as of 08/15/2021.   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 54/11/8117   Essential hypertension 12/24/2015   Type 2 diabetes mellitus ([place]) 12/24/2015   Healthcare maintenance 12/24/2015   Hyperlipidemia associated with type 2 diabetes mellitus ([place]) 12/24/2015   Social History   Socioeconomic History   Marital status: Single    Spouse name: Not on file   Number of children: 2   Years of education: Not on file    Highest education level: Not on file  Occupational History   Occupation: custodian  Tobacco Use   Smoking status: Never   Smokeless tobacco: Never  Vaping Use   Vaping Use: Never used  Substance and Sexual Activity   Alcohol use: Yes    Comment: socially   Drug use: No   Sexual activity: Yes    Birth control/protection: Condom  Other Topics Concern   Not on file  Social History Narrative   Not on file   Social Determinants of Health   Financial Resource Strain: Not on file  Food Insecurity: Not on file  Transportation Needs: Not on file  Physical Activity: Not on file  Stress: Not on file  Social Connections: Not on file  Intimate Partner Violence: Not on file    Terri Klein's family history includes Cancer in her mother; Diabetes in her maternal grandmother and mother; Heart disease in her brother, maternal grandmother, and mother; Hyperlipidemia in her brother; Hypertension in her maternal grandmother and mother.      Objective:    Vitals:   08/15/21 0844  BP: 130/76  Pulse: 95    Physical Exam Well-developed well-nourished older African-American female in no acute distress.  Height, Weight, 197 BMI 30.85  HEENT; nontraumatic normocephalic, EOMI, PE R LA, sclera anicteric.  Extremities; no clubbing cyanosis or edema skin warm and dry Neuro/Psych; alert and oriented x4, grossly nonfocal mood and affect appropriate        Assessment & Plan:   #96 61 year old African-American female with recent ER visit with 3 to 4-day history of nausea vomiting and diarrhea very likely secondary to food poisoning.  Rule out shellfish allergy CT of the abdomen unremarkable though did note distal esophageal wall thickening consistent with esophagitis. Patient really did not have any esophagitis symptoms at the time of presentation  She has completed a 2-week course of Protonix  Patient has had prior EGD 1 year ago with normal-appearing esophagus however biopsies did show changes  consistent with reflux, also with mild antral gastritis, no H. pylori.  She relates having occasional mild dysphagia symptoms to larger drier pieces of food.  #2 colon cancer screening-negative Cologuard 2023 #3 history of papillary thyroid CA 2020 status post surgery 4.  Adult onset diabetes mellitus 5.  Hypertension  Plan; we discussed treating with chronic PPI low-dose, given changes on CT and previous EGD with biopsy showing reflux changes though no acute esophagitis at that time. I also think that a barium swallow will be helpful to rule out any component of dysmotility to account for her intermittent solid food dysphagia symptoms.  We discussed follow-up colon cancer screening in 3 years and suggested colonoscopy be done at that time, as more accurate than Cologuard.  Start Protonix 20 mg p.o. every morning AC breakfast #30 and 11 refills. She has been referred to an allergist for shellfish allergy testing Further recommendations pending findings at barium swallow    Sumayya Muha S Syaire Saber PA-C 08/15/2021   Cc: Shanon Rosser, PA-C

## 2021-08-15 NOTE — Patient Instructions (Signed)
If you are age 63 or younger, your body mass index should be between 19-25. Your Body mass index is 30.85 kg/m. If this is out of the aformentioned range listed, please consider follow up with your Primary Care Provider.  ________________________________________________________  The [facility] providers would like to encourage you to use [facility] to communicate with providers for non-urgent requests or questions.  Due to long hold times on the telephone, sending your provider a message by [facility] may be a faster and more efficient way to get a response.  Please allow 48 business hours for a response.  Please remember that this is for non-urgent requests.  _______________________________________________________  Terri Klein have been scheduled for a Barium Esophogram at [facility] (1st floor of the hospital) on 08/23/2021 at 10:30 am. Please arrive 15 minutes prior to your appointment for registration. Make certain not to have anything to eat or drink 3 hours prior to your test. If you need to reschedule for any reason, please contact radiology at [phone] to do so. __________________________________________________________________ A barium swallow is an examination that concentrates on views of the esophagus. This tends to be a double contrast exam (barium and two liquids which, when combined, create a gas to distend the wall of the oesophagus) or single contrast (non-ionic iodine based). The study is usually tailored to your symptoms so a good history is essential. Attention is paid during the study to the form, structure and configuration of the esophagus, looking for functional disorders (such as aspiration, dysphagia, achalasia, motility and reflux) EXAMINATION You may be asked to change into a gown, depending on the type of swallow being performed. A radiologist and radiographer will perform the procedure. The radiologist will advise you of the type of contrast selected for your procedure and  direct you during the exam. You will be asked to stand, sit or lie in several different positions and to hold a small amount of fluid in your mouth before being asked to swallow while the imaging is performed .In some instances you may be asked to swallow barium coated marshmallows to assess the motility of a solid food bolus. The exam can be recorded as a digital or video fluoroscopy procedure. POST PROCEDURE It will take 1-2 days for the barium to pass through your system. To facilitate this, it is important, unless otherwise directed, to increase your fluids for the next 24-48hrs and to resume your normal diet.  This test typically takes about 30 minutes to perform. __________________________________________________________________________________  START Pantoprazole 20 mg 1 tablet before breakfast  Follow up pending the results of your Barium Swallow.  Thank you for entrusting me with your care and choosing [facility].  Amy Esterwood, PA-C

## 2021-08-23 ENCOUNTER — Ambulatory Visit (HOSPITAL_COMMUNITY)
Admission: RE | Admit: 2021-08-23 | Discharge: 2021-08-23 | Disposition: A | Discharge status: Home / Self Care | Payer: BC Managed Care – PPO | Source: Ambulatory Visit | Attending: Physician Assistant | Admitting: Physician Assistant

## 2021-08-23 DIAGNOSIS — K209 Esophagitis, unspecified without bleeding: Secondary | ICD-10-CM | POA: Diagnosis present

## 2021-08-23 DIAGNOSIS — R1310 Dysphagia, unspecified: Secondary | ICD-10-CM | POA: Diagnosis present

## 2021-08-29 NOTE — Progress Notes (Signed)
I agree with the above note, plan 

## 2021-09-22 ENCOUNTER — Ambulatory Visit: Payer: BC Managed Care – PPO | Admitting: Internal Medicine

## 2021-09-22 ENCOUNTER — Encounter: Payer: Self-pay | Admitting: Internal Medicine

## 2021-09-22 VITALS — BP 120/80 | HR 54 | Ht 67.0 in | Wt 197.6 lb

## 2021-09-22 DIAGNOSIS — E890 Postprocedural hypothyroidism: Secondary | ICD-10-CM

## 2021-09-22 DIAGNOSIS — C73 Malignant neoplasm of thyroid gland: Secondary | ICD-10-CM | POA: Diagnosis not present

## 2021-09-22 NOTE — Progress Notes (Signed)
Patient ID: Terri Klein, female   DOB: 15-Feb-1961, 61 y.o.   MRN: [id number]   HPI  Terri Klein is a 61 y.o.-year-old female, initially referred by Dr. Harlow Asa, presenting for follow-up for thyroid cancer and postsurgical hypothyroidism.  Last visit 1 year ago. She moved to the area from [place] in 2015.  Last visit with me 3 months ago.  Interim history: She lost a little more weight since last visit (9 pounds). She has a decreased appetite. She also has fatigue. She has heat intolerance and hot flushes. She has nausea from Protonix. She saw GI 2 mo ago >> PPIs were continued. She will have an appt with allergologist for a possible food allergic reaction manifesting with GERD.  Thyroid cancer: Reviewed and addended her thyroid cancer history: She was diagnosed with thyroid cancer after she found a neck mass in 03/2018 by looking in the mirror.  Retrospectively, she also had some dysphagia.  05/08/2018: Thyroid ultrasound showed several nodules of which 2 were biopsied  05/28/2018: FNA: Right inferior 1.5 cm; Othrer 2 dimensions: 1.4 x 1.1cm, Solid / almost completeley solid, Hypoechoic, TI-RADS total points 4  >> benign Isthmus mid 2.1cm; Other 2 dimensions: 1.7 x 1.2cm, Solid / almost completely solid, Hypoechoic, TI-RADS total points 4 >> ATYPIA OF UNDETERMINED SIGNIFICANCE OR FOLLICULAR LESION OF UNDETERMINED SIGNIFICANCE (BETHESDA CATEGORY III) >> Afirma: suspicious (malignancy risk 50%)  08/09/2018: Total thyroidectomy: Diagnosis Thyroid, thyroidectomy, Total - PAPILLARY THYROID CARCINOMA, FOLLICULAR VARIANT, 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 - CARCINOMA IS CONFINED TO THE THYROID. - FOLLICULAR ADENOMAS. - ONE BENIGN LYMPH NODE (0/1). - SEE ONCOLOGY TABLE BELOW. Microscopic Comment THYROID GLAND: Procedure: Thyroidectomy. Tumor Focality: Unifocal. Tumor Site: Isthmus. Tumor Size: 2.2 cm, gross measurement. Histologic Type: Papillary thyroid carcinoma, follicular variant. Margins: Negative  for carcinoma. Angioinvasion: Not identified. Lymphatic Invasion: Not identified. Extrathyroidal: Not identified. Regional Lymph Nodes: Number of Lymph Nodes Involved: 0 of 1. Pathologic Stage Classification (pTNM, AJCC 8th Edition): pT2, pN0.  She did not have RAI treatment.  Neck U/S (10/07/2019):  Status post total thyroidectomy. No thyroid bed abnormality by Ultrasound. No regional adenopathy.  Review latest thyroglobulin and ATA antibody levels: Lab Results  Component Value Date   THYROGLB <0.1 (L) 09/15/2020   THYROGLB <0.1 (L) 09/19/2019   THYROGLB 0.3 (L) 09/11/2018   Lab Results  Component Value Date   THGAB <1 09/15/2020   THGAB <1 09/19/2019   THGAB <1 09/11/2018   Postsurgical hypothyroidism:  Pt is on levothyroxine 137 mcg daily, taken: - in am (takes it at 4 am, goes to work at 6 am) - fasting - at least 30 min from b'fast - no Ca, Fe - + PPIs moved 4 hours later - + MVI's 3-4 hours after levothyroxine - not on Biotin  Reviewed her TFTs: Lab Results  Component Value Date   TSH 0.98 09/15/2020   TSH 1.83 09/19/2019   TSH 1.43 01/30/2019   TSH 1.96 12/06/2018   TSH 7.49 (H) 10/25/2018   TSH 12.96 (H) 09/11/2018   FREET4 1.14 09/15/2020   FREET4 1.17 09/19/2019   FREET4 1.13 01/30/2019   FREET4 1.24 12/06/2018   FREET4 0.97 10/25/2018   FREET4 0.74 09/11/2018    She has a history of 2 parathyroid glands resected >30 years ago for hyperparathyroidism causing her kidney stones.  Calcium normalized afterwards..  Postsurgical hypocalcemia: Of note, her calcium postop was low, at 7.9 but normalized afterwards.  She stopped Tums at the indication of Dr. Harlow Asa in 08/27/2018.  Latest  calcium levels was normal: Lab Results  Component Value Date   CALCIUM 9.1 07/19/2021   CALCIUM 9.0 06/18/2020   CALCIUM 9.5 05/17/2020   CALCIUM 9.2 09/11/2018   CALCIUM 7.8 (L) 08/10/2018   CALCIUM 9.4 08/02/2018   CALCIUM 9.3 12/23/2015   She denies perioral  numbness and cramps in her hands.  Latest vitamin D level was normal: Lab Results  Component Value Date   VD25OH 53.9 09/19/2019   VD25OH 41.70 09/11/2018   She was on ergocalciferol 50,000 units weekly >> off since 2015.  Pt denies: - feeling nodules in neck - hoarseness - dysphagia - choking  No FH of thyroid cancer. No h/o radiation tx to head or neck. No herbal supplements. No Biotin use. No recent steroids use.   She also has a h/o type 2 diabetes.  Latest HbA1c: Lab Results  Component Value Date   HGBA1C 6.3 (H) 08/02/2018   ROS:  + see HPI  I reviewed pt's medications, allergies, PMH, social hx, family hx, and changes were documented in the history of present illness. Otherwise, unchanged from my initial visit note.  Past Medical History:  Diagnosis Date   Diabetes mellitus without complication ([place])    Hyperlipidemia    Hypertension    Kidney stone    during her 20's   Past Surgical History:  Procedure Laterality Date   THYROIDECTOMY N/A 08/09/2018   Procedure: TOTAL THYROIDECTOMY;  Surgeon: Armandina Gemma, MD;  Location: [facility];  Service: General;  Laterality: N/A;   UPPER GASTROINTESTINAL ENDOSCOPY  08/13/2020   D. Jacobs,MD [facility]   Social History   Socioeconomic History   Marital status: Single    Spouse name: Not on file   Number of children: 2   Years of education: Not on file   Highest education level: Not on file  Occupational History   Occupation: custodian  Scientist, product/process development strain: Not on file   Food insecurity    Worry: Not on file    Inability: Not on file   Transportation needs    Medical: Not on file    Non-medical: Not on file  Tobacco Use   Smoking status: Never Smoker   Smokeless tobacco: Never Used  Substance and Sexual Activity   Alcohol use: Yes    Comment: socially, beer   Drug use: No   Sexual activity: Yes    Birth control/protection: Condom   Current Outpatient Medications on File Prior to Visit   Medication Sig Dispense Refill   amLODipine-benazepril (LOTREL) 10-40 MG capsule Take 1 capsule by mouth daily. 90 capsule 1   atorvastatin (LIPITOR) 20 MG tablet Take 20 mg by mouth daily.     BLACK ELDERBERRY PO Take 1 tablet by mouth every other day.     glimepiride (AMARYL) 4 MG tablet Take 1 tablet (4 mg total) by mouth daily with breakfast. 90 tablet 1   levothyroxine (SYNTHROID) 137 MCG tablet Take 1 tablet (137 mcg total) by mouth daily before breakfast. 90 tablet 3   metoprolol (LOPRESSOR) 50 MG tablet Take 1 tablet (50 mg total) by mouth daily. 90 tablet 1   multivitamin-iron-minerals-folic acid (CENTRUM) chewable tablet Chew 1 tablet by mouth daily. 90 tablet 1   ondansetron (ZOFRAN) 4 MG tablet Take 1 tablet (4 mg total) by mouth every 6 (six) hours. 12 tablet 0   pantoprazole (PROTONIX) 20 MG tablet Take 1 tablet (20 mg total) by mouth daily before breakfast. 30 tablet 11   sitaGLIPtin-metformin (  JANUMET) 50-1000 MG tablet Take 1 tablet by mouth 2 (two) times a day.     No current facility-administered medications on file prior to visit.   Allergies  Allergen Reactions   Iodine     immobilized   Family History  Problem Relation Age of Onset   Cancer Mother        Cervical   Diabetes Mother    Hypertension Mother    Heart disease Mother    Hyperlipidemia Brother    Hypertension Maternal Grandmother    Heart disease Maternal Grandmother    Diabetes Maternal Grandmother    Heart disease Brother    Colon cancer Neg Hx    Esophageal cancer Neg Hx    Stomach cancer Neg Hx    Rectal cancer Neg Hx    PE: BP 120/80 (BP Location: Right Arm, Patient Position: Sitting, Cuff Size: Normal)   Pulse (!) 54   Ht 5' 7"  (1.702 m)   Wt 197 lb 9.6 oz (89.6 kg)   LMP 04/20/2008   SpO2 98%   BMI 30.95 kg/m  Wt Readings from Last 3 Encounters:  09/22/21 197 lb 9.6 oz (89.6 kg)  08/15/21 197 lb (89.4 kg)  09/15/20 206 lb (93.4 kg)   Constitutional: overweight, in NAD Eyes:  PERRLA, EOMI, no exophthalmos ENT: moist mucous membranes, no neck masses palpable, no cervical lymphadenopathy Cardiovascular: RRR, No MRG Respiratory: CTA B Musculoskeletal: no deformities Skin: moist, warm, no rashes Neurological: no tremor with outstretched hands  ASSESSMENT: 1. Thyroid cancer - see HPI  2. Postsurgical Hypothyroidism  3. Postsurgical hypocalcemia  PLAN:  1. 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  PTC is a slow-growing cancer, with good prognosis.  RAI treatment was not indicated for her -We will continue to follow her by checking thyroglobulin levels and also neck ultrasounds as needed -Latest neck ultrasound results were reviewed from 10/07/2019 and there were no recurrences or suspicious masses in her neck.  At that time, she reported a lump in her lower neck and problems swallowing.  These resolved. -At last visit, her thyroglobulin and antithyroglobulin antibodies are undetectable, which was excellent -I we will see her back in a year  2. Patient with history of total thyroidectomy for cancer, now with iatrogenic hypothyroidism - latest thyroid labs reviewed with pt. >> normal: Lab Results  Component Value Date   TSH 0.98 09/15/2020  - she continues on LT4 137 mcg daily - pt feels good on this dose.  She lost 6 pounds before last visit and 9 more pounds since then.  She describes decreased appetite.  She does have heat intolerance.  We discussed that it could be due to menopause but it could also be a rare side effect of PPIs. - we discussed about taking the thyroid hormone every day, with water, >30 minutes before breakfast, separated by >4 hours from acid reflux medications, calcium, iron, multivitamins. Pt. is taking it correctly now.  At last visit, she  does started Protonix which she was taking approximately 2-2.5 hours after levothyroxine and I advised her to move it 4 hours later at least. - will check thyroid tests today: TSH and fT4 - If labs are abnormal, she will need to return for repeat TFTs in 1.5 months  3. Postsurgical hypocalcemia -She was previously on  ergocalciferol, but not anymore.  Her vitamin D level was normal at last visit. -At last visit, calcium was also normal.  Repeat calcium level was normal in 07/2021. -No symptoms of hypocalcemia at today's visit  Needs refills. Component     Latest Ref Rng 09/23/2021  TSH     0.35 - 5.50 uIU/mL 0.28 (L)   T4,Free(Direct)     0.60 - 1.60 ng/dL 1.44   Thyroglobulin Ab     < or = 1 IU/mL <1   Thyroglobulin     ng/mL 0.1 (L)   Comment --     TSH is suppressed.  Thyroglobulin is barely detectable.  I would suggest to decrease the dose of levothyroxine to 125 mcg daily and recheck the thyroid function test in 1.5 months.  Philemon Kingdom, MD PhD [facility]

## 2021-09-22 NOTE — Patient Instructions (Signed)
Please continue Levothyroxine 137 mcg daily.  Take the thyroid hormone every day, with water, at least 30 minutes before breakfast, separated by at least 4 hours from: - acid reflux medications - calcium - iron - multivitamins  Please stop at the lab.  Please come back for a follow-up appointment in 1 year. 

## 2021-09-23 ENCOUNTER — Other Ambulatory Visit (INDEPENDENT_AMBULATORY_CARE_PROVIDER_SITE_OTHER): Payer: BC Managed Care – PPO

## 2021-09-23 DIAGNOSIS — C73 Malignant neoplasm of thyroid gland: Secondary | ICD-10-CM

## 2021-09-23 DIAGNOSIS — E890 Postprocedural hypothyroidism: Secondary | ICD-10-CM

## 2021-09-23 LAB — TSH: TSH: 0.28 u[IU]/mL — ABNORMAL LOW (ref 0.35–5.50)

## 2021-09-23 LAB — T4, FREE: Free T4: 1.44 ng/dL (ref 0.60–1.60)

## 2021-09-26 LAB — THYROGLOBULIN LEVEL: Thyroglobulin: 0.1 ng/mL — ABNORMAL LOW

## 2021-09-26 LAB — THYROGLOBULIN ANTIBODY: Thyroglobulin Ab: <1 IU/mL (ref <=1)

## 2021-09-28 MED ORDER — LEVOTHYROXINE SODIUM 125 MCG PO TABS: 125.0000 ug | ORAL_TABLET | Freq: Every day | ORAL | 3 refills | Status: DC

## 2021-12-08 ENCOUNTER — Other Ambulatory Visit: Payer: Self-pay | Admitting: Internal Medicine

## 2022-03-13 ENCOUNTER — Other Ambulatory Visit: Payer: Self-pay | Admitting: Internal Medicine

## 2022-05-09 ENCOUNTER — Telehealth: Payer: Self-pay | Admitting: Internal Medicine

## 2022-05-09 NOTE — Telephone Encounter (Signed)
Patient sent a fax through [employer] of labs that were done at [employer], PA-C's office with notes that she herself wrote.  Patient is asking questions about the labs and the dosage of her Levothyroxine that her PCP changed.  These labs and notes were printed and put in Dr. Arman Filter folder in front office and labs were also put into chart media and routed to Dr. Cruzita Lederer.

## 2022-05-10 ENCOUNTER — Encounter: Payer: Self-pay | Admitting: Internal Medicine

## 2022-05-10 ENCOUNTER — Other Ambulatory Visit: Payer: Self-pay | Admitting: Internal Medicine

## 2022-05-10 DIAGNOSIS — E890 Postprocedural hypothyroidism: Secondary | ICD-10-CM

## 2022-05-10 MED ORDER — LEVOTHYROXINE SODIUM 112 MCG PO TABS: 112.0000 ug | ORAL_TABLET | Freq: Every day | ORAL | 1 refills | Status: DC

## 2022-06-04 IMAGING — CT CT ABD-PELV W/O CM
2 of 4 series · 16 of 46 positions shown, 18 images · non-contrast
Comparison: Ultrasound abdomen 06/16/2020, CT abdomen pelvis
05/18/2020

CLINICAL DATA: Right lower quadrant abdominal pain.

EXAM:
CT ABDOMEN AND PELVIS WITHOUT CONTRAST
TECHNIQUE: Multidetector CT imaging of the abdomen and pelvis was performed
following the standard protocol without IV contrast.

[Series 3: ap without · axial · non-contrast · 0.84mm/px · z∈[-471,-51]mm · 13 of 94 slices shown, 15 images]
[im 5/94  soft-tissue]
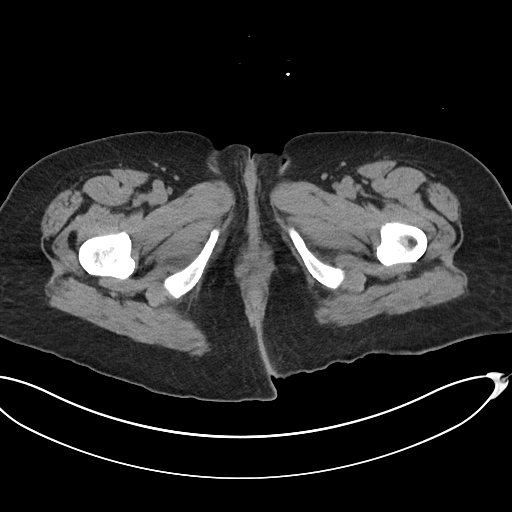
[im 5/94  bone]
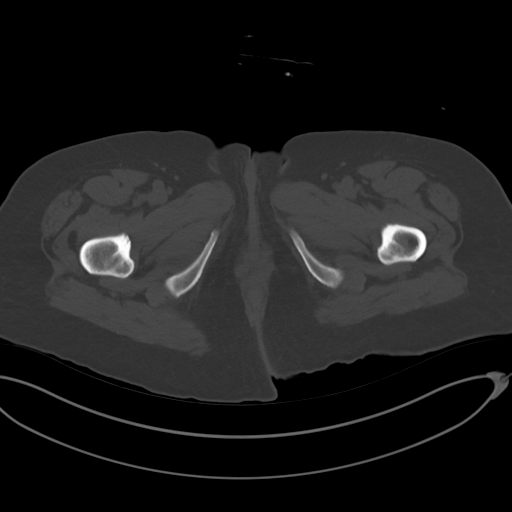
[im 15/94  soft-tissue]
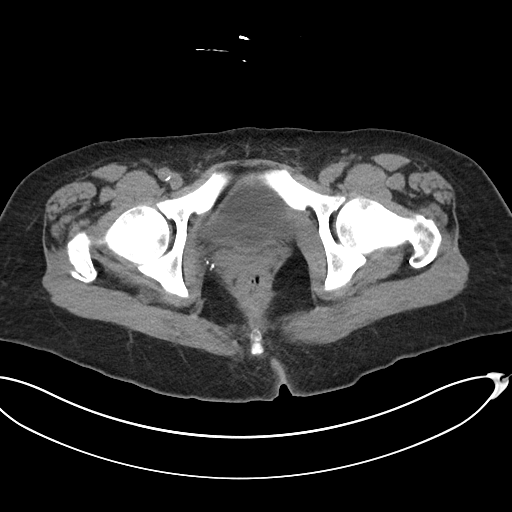
[im 20/94  soft-tissue]
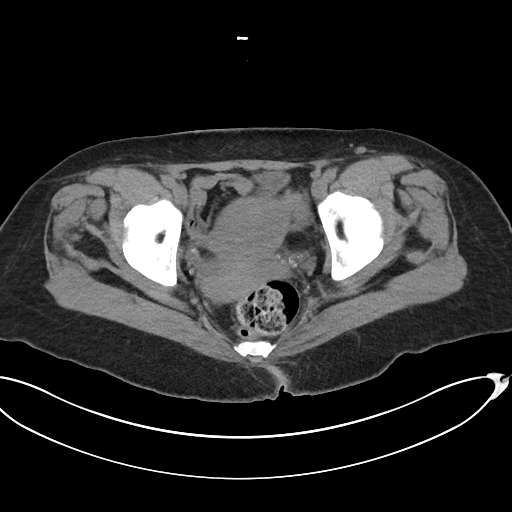
[im 25/94  soft-tissue]
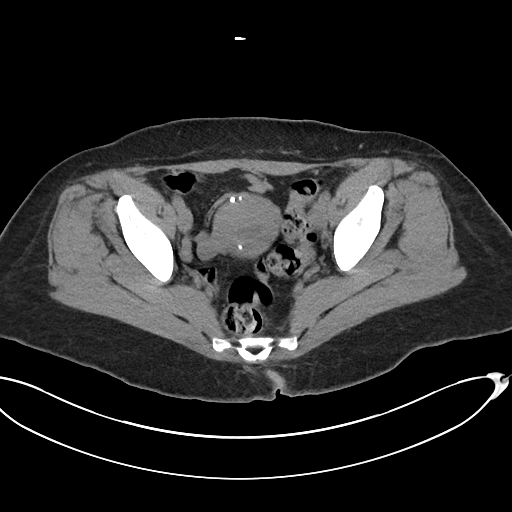
[im 35/94  soft-tissue]
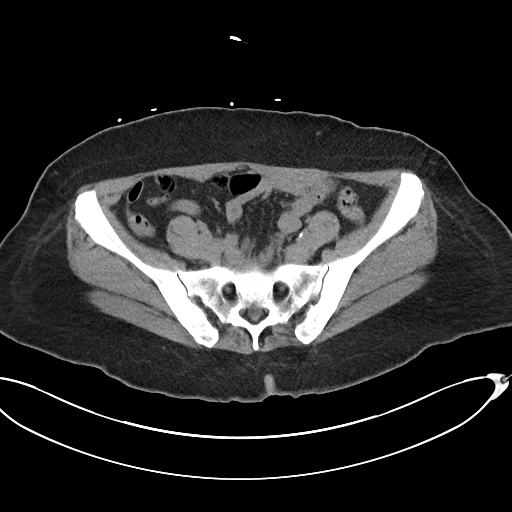
[im 40/94  soft-tissue]
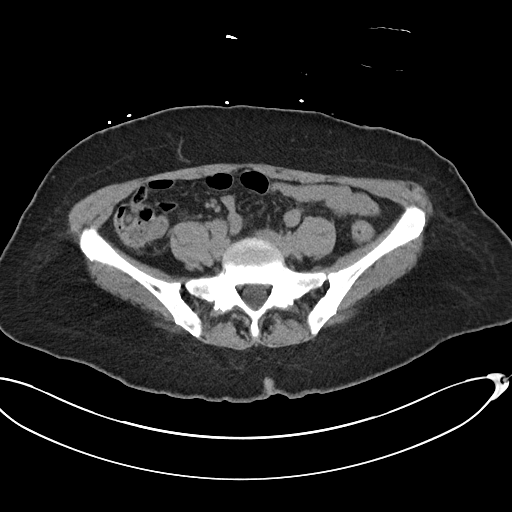
[im 49/94  soft-tissue]
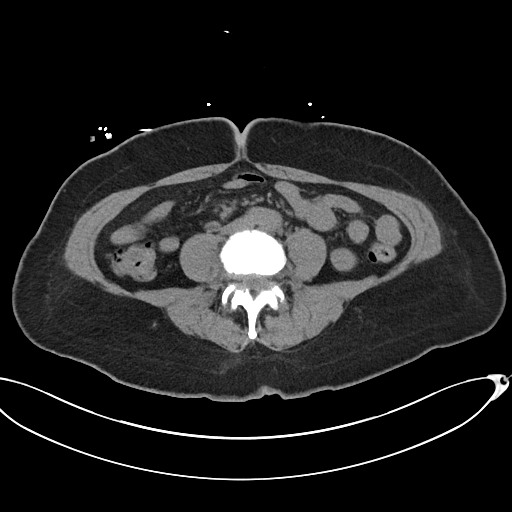
[im 54/94  soft-tissue]
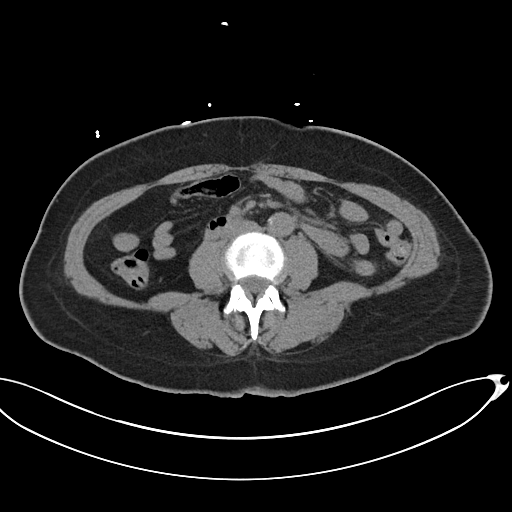
[im 59/94  soft-tissue]
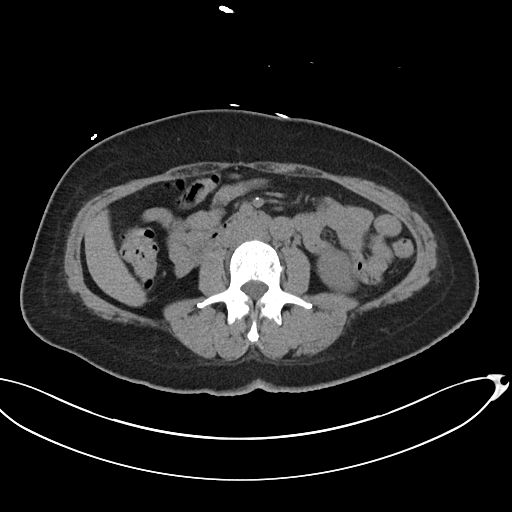
[im 59/94  bone]
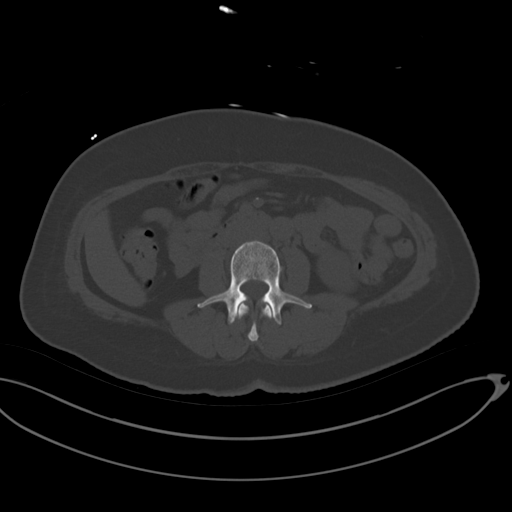
[im 69/94  soft-tissue]
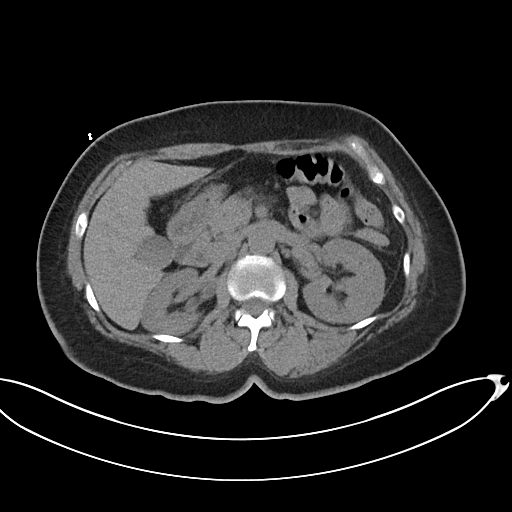
[im 74/94  soft-tissue]
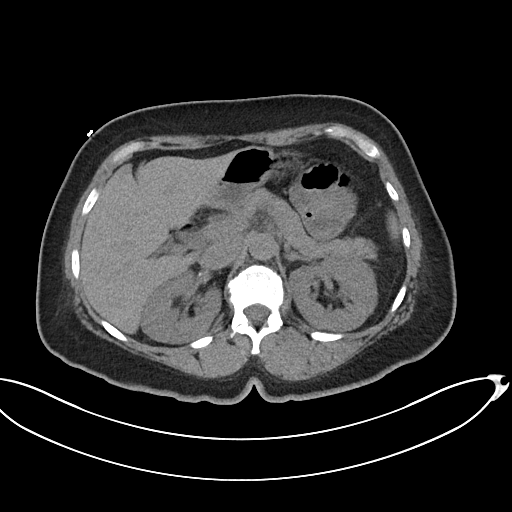
[im 79/94  soft-tissue]
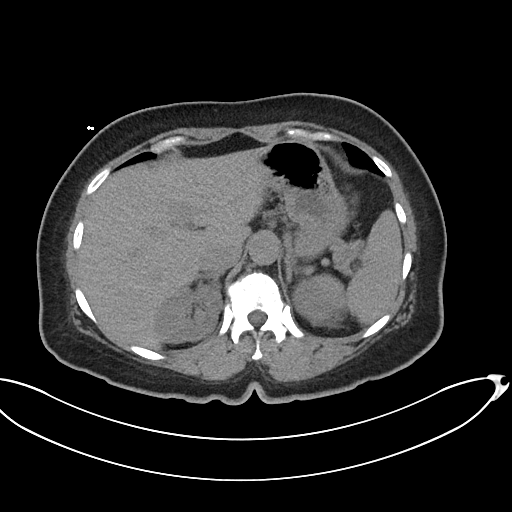
[im 89/94  soft-tissue]
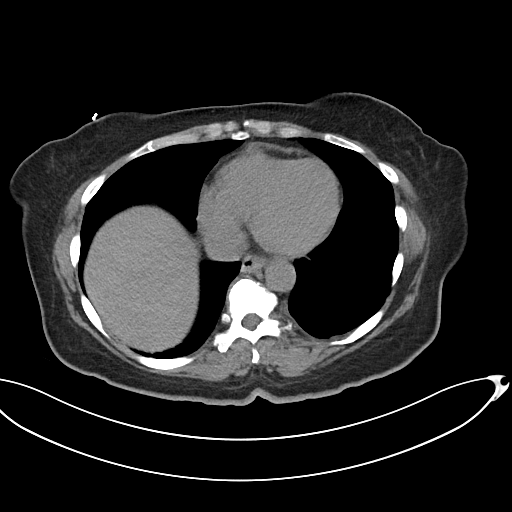

[Series 6: cor · coronal · 0.91mm/px · 3 of 89 slices shown]
[im 30/89  soft-tissue]
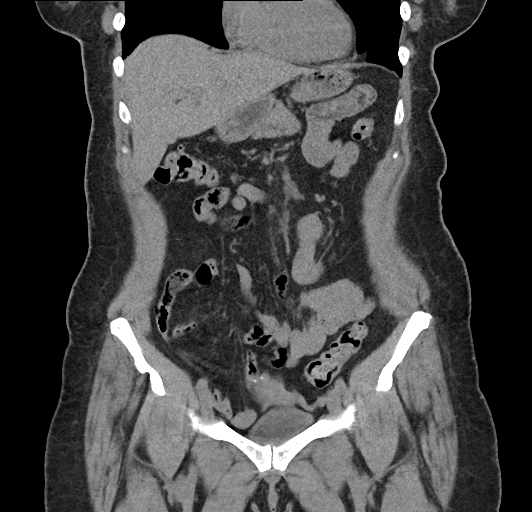
[im 40/89  soft-tissue]
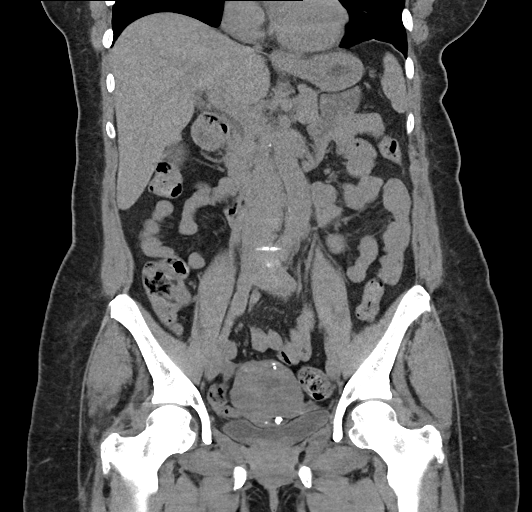
[im 49/89  soft-tissue]
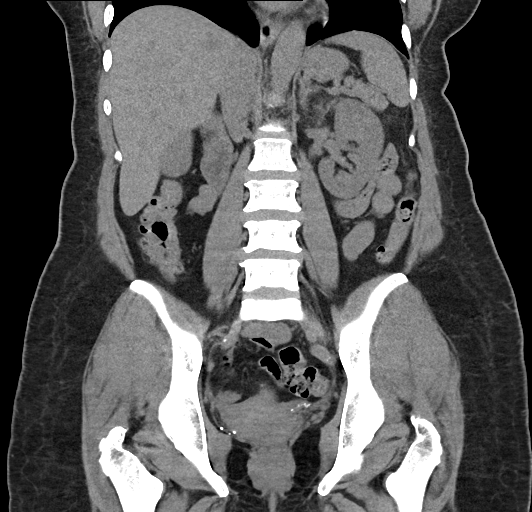

[16 of 46 positions shown; findings below may reference images not displayed]

FINDINGS: Lower chest: Bilateral lower lobe subsegmental atelectasis.

Hepatobiliary: No focal liver abnormality. No gallstones,
gallbladder wall thickening, or pericholecystic fluid. No biliary
dilatation.

Pancreas: No focal lesion. Normal pancreatic contour. No surrounding
inflammatory changes. No main pancreatic ductal dilatation.

Spleen: Normal in size without focal abnormality.

Adrenals/Urinary Tract:

No adrenal nodule bilaterally.

Couple of punctate calcifications within the right kidney. No
hydronephrosis and no contour-deforming renal mass. No
ureterolithiasis or hydroureter.

The urinary bladder is unremarkable.

Stomach/Bowel: Stomach is within normal limits. No evidence of bowel
wall thickening or dilatation. Few scattered colonic diverticula.
Appendix appears normal.

Vascular/Lymphatic: No abdominal aorta or iliac aneurysm. Mild
atherosclerotic plaque of the aorta and its branches. No abdominal,
pelvic, or inguinal lymphadenopathy.

Reproductive: Coarse calcifications within the uterine wall
suggestive of degenerative fibroids. Otherwise the uterus and
bilateral adnexal regions are unremarkable.

Other: No intraperitoneal free fluid. No intraperitoneal free gas.
No organized fluid collection.

Musculoskeletal:

No abdominal wall hernia or abnormality.

No suspicious lytic or blastic osseous lesions. No acute displaced
fracture. Multilevel degenerative changes of the spine.
IMPRESSION: 1. Couple of nonobstructive punctate right nephrolithiasis.
2. Normal appendix.
3. Few scattered colonic diverticula with no findings of acute
diverticulitis.
4. Uterine fibroids.
5.  Aortic Atherosclerosis (X682I-Q28.8).

## 2022-06-14 ENCOUNTER — Ambulatory Visit (AMBULATORY_SURGERY_CENTER): Payer: BC Managed Care – PPO | Admitting: *Deleted

## 2022-06-14 ENCOUNTER — Encounter: Payer: Self-pay | Admitting: Gastroenterology

## 2022-06-14 VITALS — Ht 67.0 in | Wt 190.0 lb

## 2022-06-14 DIAGNOSIS — Z1211 Encounter for screening for malignant neoplasm of colon: Secondary | ICD-10-CM

## 2022-06-14 MED ORDER — NA SULFATE-K SULFATE-MG SULF 17.5-3.13-1.6 GM/177ML PO SOLN: 1.0000 | Freq: Once | ORAL | 0 refills | Status: AC

## 2022-06-14 NOTE — Progress Notes (Signed)

## 2022-06-19 ENCOUNTER — Other Ambulatory Visit: Payer: Self-pay | Admitting: Internal Medicine

## 2022-06-21 ENCOUNTER — Other Ambulatory Visit (INDEPENDENT_AMBULATORY_CARE_PROVIDER_SITE_OTHER): Payer: BC Managed Care – PPO

## 2022-06-21 DIAGNOSIS — E890 Postprocedural hypothyroidism: Secondary | ICD-10-CM

## 2022-06-21 LAB — T4, FREE: Free T4: 1.04 ng/dL (ref 0.60–1.60)

## 2022-06-21 LAB — TSH: TSH: 5.27 u[IU]/mL (ref 0.35–5.50)

## 2022-06-23 ENCOUNTER — Telehealth: Payer: Self-pay | Admitting: Gastroenterology

## 2022-06-23 DIAGNOSIS — Z1211 Encounter for screening for malignant neoplasm of colon: Secondary | ICD-10-CM

## 2022-06-23 MED ORDER — NA SULFATE-K SULFATE-MG SULF 17.5-3.13-1.6 GM/177ML PO SOLN
1.0000 | Freq: Once | ORAL | 0 refills | Status: AC
Start: 2022-06-23 — End: 2022-06-23

## 2022-06-23 NOTE — Telephone Encounter (Signed)
Pt.[facility] [place] resubmitted prep [facility] and made her aware that it was resubmitted to the [facility] she had on file she stated"ok and hung up".

## 2022-06-23 NOTE — Telephone Encounter (Signed)
Inbound call from patient requesting a f/u call to discuss prep. Please advise.

## 2022-06-23 NOTE — Addendum Note (Signed)
Addended by: Luellen Pucker on: 06/23/2022 11:23 AM   Modules accepted: Orders

## 2022-07-04 ENCOUNTER — Telehealth: Payer: Self-pay | Admitting: Gastroenterology

## 2022-07-04 NOTE — Telephone Encounter (Signed)
Error

## 2022-07-11 ENCOUNTER — Encounter: Payer: BC Managed Care – PPO | Admitting: Gastroenterology

## 2022-08-01 ENCOUNTER — Ambulatory Visit (AMBULATORY_SURGERY_CENTER): Payer: BC Managed Care – PPO

## 2022-08-01 VITALS — Ht 67.0 in | Wt 200.0 lb

## 2022-08-01 DIAGNOSIS — Z1211 Encounter for screening for malignant neoplasm of colon: Secondary | ICD-10-CM

## 2022-08-01 NOTE — Progress Notes (Signed)

## 2022-08-18 ENCOUNTER — Other Ambulatory Visit (INDEPENDENT_AMBULATORY_CARE_PROVIDER_SITE_OTHER): Payer: BC Managed Care – PPO

## 2022-08-18 DIAGNOSIS — E890 Postprocedural hypothyroidism: Secondary | ICD-10-CM

## 2022-08-18 LAB — TSH: TSH: 2.02 u[IU]/mL (ref 0.35–5.50)

## 2022-08-18 LAB — T4, FREE: Free T4: 1.28 ng/dL (ref 0.60–1.60)

## 2022-08-31 ENCOUNTER — Encounter: Payer: Self-pay | Admitting: Gastroenterology

## 2022-08-31 ENCOUNTER — Ambulatory Visit (AMBULATORY_SURGERY_CENTER): Payer: BC Managed Care – PPO | Admitting: Gastroenterology

## 2022-08-31 VITALS — BP 127/76 | HR 64 | Temp 98.0°F | Resp 10 | Ht 67.0 in | Wt 200.0 lb

## 2022-08-31 DIAGNOSIS — Z09 Encounter for follow-up examination after completed treatment for conditions other than malignant neoplasm: Secondary | ICD-10-CM

## 2022-08-31 DIAGNOSIS — D122 Benign neoplasm of ascending colon: Secondary | ICD-10-CM

## 2022-08-31 DIAGNOSIS — Z86010 Personal history of colonic polyps: Secondary | ICD-10-CM

## 2022-08-31 DIAGNOSIS — K621 Rectal polyp: Secondary | ICD-10-CM | POA: Diagnosis not present

## 2022-08-31 DIAGNOSIS — Z1211 Encounter for screening for malignant neoplasm of colon: Secondary | ICD-10-CM

## 2022-08-31 DIAGNOSIS — D120 Benign neoplasm of cecum: Secondary | ICD-10-CM | POA: Diagnosis not present

## 2022-08-31 DIAGNOSIS — D128 Benign neoplasm of rectum: Secondary | ICD-10-CM

## 2022-08-31 MED ORDER — SODIUM CHLORIDE 0.9 % IV SOLN
500.0000 mL | Freq: Once | INTRAVENOUS | Status: DC
Start: 2022-08-31 — End: 2022-08-31

## 2022-08-31 NOTE — Op Note (Signed)
[facility] Patient Name: Terri Klein Procedure Date: 08/31/2022 8:17 AM MRN: [id number] Endoscopist: Lynann Bologna , MD, [id number] Age: 62 Referring MD:  Date of Birth: 1961-02-27 Gender: Female Account #: [id number] Procedure:                Colonoscopy Indications:              High risk colon cancer surveillance: Personal                            history of colonic polyps Medicines:                Monitored Anesthesia Care Procedure:                Pre-Anesthesia Assessment:                           - Prior to the procedure, a History and Physical                            was performed, and patient medications and                            allergies were reviewed. The patient's tolerance of                            previous anesthesia was also reviewed. The risks                            and benefits of the procedure and the sedation                            options and risks were discussed with the patient.                            All questions were answered, and informed consent                            was obtained. Prior Anticoagulants: The patient has                            taken no anticoagulant or antiplatelet agents. ASA                            Grade Assessment: II - A patient with mild systemic                            disease. After reviewing the risks and benefits,                            the patient was deemed in satisfactory condition to                            undergo the procedure.  After obtaining informed consent, the colonoscope                            was passed under direct vision. Throughout the                            procedure, the patient's blood pressure, pulse, and                            oxygen saturations were monitored continuously. The                            Olympus CF-HQ190L ([id number]) Colonoscope was                            introduced through the anus and advanced  to the the                            cecum, identified by appendiceal orifice and                            ileocecal valve. The colonoscopy was performed                            without difficulty. The patient tolerated the                            procedure well. The quality of the bowel                            preparation was adequate to identify polyps. The                            ileocecal valve, appendiceal orifice, and rectum                            were photographed. Scope In: 8:43:03 AM Scope Out: 9:00:38 AM Scope Withdrawal Time: 0 hours 15 minutes 17 seconds  Total Procedure Duration: 0 hours 17 minutes 35 seconds  Findings:                 Two sessile polyps were found in the proximal                            ascending colon and cecum (10 mm). The polyps were                            4 to 10 mm in size. These polyps were removed with                            a cold snare. Resection and retrieval were complete.                           A 4 mm polyp was found in  the rectum. The polyp was                            sessile. The polyp was removed with a cold snare.                            Resection and retrieval were complete.                           There was a small lipoma, 8 mm in diameter, in the                            ascending colon and in the cecum.                           A few small-mouthed diverticula were found in the                            sigmoid colon, descending colon and transverse                            colon.                           A tattoo was seen in the mid descending colon. The                            tattoo site appeared normal.                           Non-bleeding internal hemorrhoids were found during                            retroflexion. The hemorrhoids were small and Grade                            I (internal hemorrhoids that do not prolapse).                           The exam was otherwise without  abnormality on                            direct and retroflexion views. Complications:            No immediate complications. Estimated Blood Loss:     Estimated blood loss: none. Impression:               - Two 4 to 10 mm polyps in the proximal ascending                            colon and in the cecum, removed with a cold snare.                            Resected and retrieved.                           -  One 4 mm polyp in the rectum, removed with a cold                            snare. Resected and retrieved.                           - Small lipoma in the ascending colon and in the                            cecum.                           - Mild Diverticulosis in the sigmoid colon, in the                            descending colon and in the transverse colon.                           - A tattoo was seen in the mid descending colon.                            The tattoo site appeared normal.                           - Non-bleeding internal hemorrhoids.                           - The examination was otherwise normal on direct                            and retroflexion views. Recommendation:           - Patient has a contact number available for                            emergencies. The signs and symptoms of potential                            delayed complications were discussed with the                            patient. Return to normal activities tomorrow.                            Written discharge instructions were provided to the                            patient.                           - Resume previous high-fiber diet.                           - Continue present medications.                           -  Await pathology results.                           - No aspirin, ibuprofen, naproxen, or other                            non-steroidal anti-inflammatory drugs for 5 days                            after polyp removal.                           - Repeat  colonoscopy for surveillance based on                            pathology results.                           - The findings and recommendations were discussed                            with the patient's family. Lynann Bologna, MD 08/31/2022 9:07:11 AM This report has been signed electronically.

## 2022-08-31 NOTE — Patient Instructions (Signed)
YOU HAD AN ENDOSCOPIC PROCEDURE TODAY AT THE [facility]:   Refer to the procedure report that was given to you for any specific questions about what was found during the examination.  If the procedure report does not answer your questions, please call your gastroenterologist to clarify.  If you requested that your care partner not be given the details of your procedure findings, then the procedure report has been included in a sealed envelope for you to review at your convenience later.  YOU SHOULD EXPECT: Some feelings of bloating in the abdomen. Passage of more gas than usual.  Walking can help get rid of the air that was put into your GI tract during the procedure and reduce the bloating. If you had a lower endoscopy (such as a colonoscopy or flexible sigmoidoscopy) you may notice spotting of blood in your stool or on the toilet paper. If you underwent a bowel prep for your procedure, you may not have a normal bowel movement for a few days.  Please Note:  You might notice some irritation and congestion in your nose or some drainage.  This is from the oxygen used during your procedure.  There is no need for concern and it should clear up in a day or so.  SYMPTOMS TO REPORT IMMEDIATELY:  Following lower endoscopy (colonoscopy or flexible sigmoidoscopy):  Excessive amounts of blood in the stool  Significant tenderness or worsening of abdominal pains  Swelling of the abdomen that is new, acute  Fever of 100F or higher  Recommendation:           - Patient has a contact number available for                            emergencies. The signs and symptoms of potential                            delayed complications were discussed with the                            patient. Return to normal activities tomorrow.                            Written discharge instructions were provided to the                            patient.                           - Resume previous high-fiber diet.                            - Continue present medications.                           - Await pathology results.                           - No aspirin, ibuprofen, naproxen, or other                            non-steroidal anti-inflammatory drugs for  5 days                            after polyp removal.                           - Repeat colonoscopy for surveillance based on                            pathology results.                           - The findings and recommendations were discussed                            with the patient's family.  Handouts on polyps, hemorrhoids and diverticulosis given.  For urgent or emergent issues, a gastroenterologist can be reached at any hour by calling [phone]. Do not use MyChart messaging for urgent concerns.    DIET:  We do recommend a small meal at first, but then you may proceed to your regular diet.  Drink plenty of fluids but you should avoid alcoholic beverages for 24 hours.  ACTIVITY:  You should plan to take it easy for the rest of today and you should NOT DRIVE or use heavy machinery until tomorrow (because of the sedation medicines used during the test).    FOLLOW UP: Our staff will call the number listed on your records the next business day following your procedure.  We will call around 7:15- 8:00 am to check on you and address any questions or concerns that you may have regarding the information given to you following your procedure. If we do not reach you, we will leave a message.     If any biopsies were taken you will be contacted by phone or by letter within the next 1-3 weeks.  Please call us at [phone] if you have not heard about the biopsies in 3 weeks.    SIGNATURES/CONFIDENTIALITY: You and/or your care partner have signed paperwork which will be entered into your electronic medical record.  These signatures attest to the fact that that the information above on your After Visit Summary has been reviewed and is  understood.  Full responsibility of the confidentiality of this discharge information lies with you and/or your care-partner.

## 2022-08-31 NOTE — Progress Notes (Signed)
Called to room to assist during endoscopic procedure.  Patient ID and intended procedure confirmed with present staff. Received instructions for my participation in the procedure from the performing physician.  

## 2022-08-31 NOTE — Progress Notes (Signed)
[facility] History and Physical   Primary Care Physician:  Lindaann Pascal, PA-C   Reason for Procedure:   H/O polyps  Plan:    colon     HPI: Terri Klein is a 62 y.o. female  For colon today   history of colon polyps-2 prior colonoscopies with polyps both done at [facility], by patient report the last was approximately 5 years ago and she believes she did have polyps.  Neg cologuard prev ? 2021      Past Medical History:  Diagnosis Date   Diabetes mellitus without complication (HCC)    GERD (gastroesophageal reflux disease)    Hyperlipidemia    Hypertension    Kidney stone    during her 20's   Thyroid disease     Past Surgical History:  Procedure Laterality Date   COLONOSCOPY     kidney stones     MYOMECTOMY     THYROIDECTOMY N/A 08/09/2018   Procedure: TOTAL THYROIDECTOMY;  Surgeon: Darnell Level, MD;  Location: [facility];  Service: General;  Laterality: N/A;   UPPER GASTROINTESTINAL ENDOSCOPY  08/13/2020   D. Jacobs,MD [facility]    Prior to Admission medications   Medication Sig Start Date End Date Taking? Authorizing Provider  amLODipine-benazepril (LOTREL) 10-40 MG capsule Take 1 capsule by mouth daily. 12/24/15   Kathlynn Grate, DO  atorvastatin (LIPITOR) 20 MG tablet Take 20 mg by mouth daily.    [provider]  BLACK ELDERBERRY PO Take 1 tablet by mouth every other day.    [provider]  EPINEPHrine (EPIPEN 2-PAK) 0.3 mg/0.3 mL IJ SOAJ injection 0.3 mg [place]/IM x1; Info: may repeat dose x1 after 5-11min 01/22/22   [provider]  levothyroxine (SYNTHROID) 112 MCG tablet Take 1 tablet (112 mcg total) by mouth daily. 05/10/22   Carlus Pavlov, MD  metoprolol (LOPRESSOR) 50 MG tablet Take 1 tablet (50 mg total) by mouth daily. 12/24/15 09/19/19  Kathlynn Grate, DO  metoprolol succinate (TOPROL-XL) 50 MG 24 hr tablet Take 50 mg by mouth daily.    [provider]   multivitamin-iron-minerals-folic acid (CENTRUM) chewable tablet Chew 1 tablet by mouth daily. 12/24/15   Kathlynn Grate, DO  pantoprazole (PROTONIX) 40 MG tablet Take 40 mg by mouth 3 (three) times daily.    [provider]  sitaGLIPtin-metformin (JANUMET) 50-1000 MG tablet Take 1 tablet by mouth 2 (two) times a day.    [provider]    Current Outpatient Medications  Medication Sig Dispense Refill   amLODipine-benazepril (LOTREL) 10-40 MG capsule Take 1 capsule by mouth daily. 90 capsule 1   atorvastatin (LIPITOR) 20 MG tablet Take 20 mg by mouth daily.     BLACK ELDERBERRY PO Take 1 tablet by mouth every other day.     EPINEPHrine (EPIPEN 2-PAK) 0.3 mg/0.3 mL IJ SOAJ injection 0.3 mg [place]/IM x1; Info: may repeat dose x1 after 5-72min     levothyroxine (SYNTHROID) 112 MCG tablet Take 1 tablet (112 mcg total) by mouth daily. 90 tablet 1   metoprolol (LOPRESSOR) 50 MG tablet Take 1 tablet (50 mg total) by mouth daily. 90 tablet 1   metoprolol succinate (TOPROL-XL) 50 MG 24 hr tablet Take 50 mg by mouth daily.     multivitamin-iron-minerals-folic acid (CENTRUM) chewable tablet Chew 1 tablet by mouth daily. 90 tablet 1   pantoprazole (PROTONIX) 40 MG tablet Take 40 mg by mouth 3 (three) times daily.     sitaGLIPtin-metformin (JANUMET)  50-1000 MG tablet Take 1 tablet by mouth 2 (two) times a day.     No current facility-administered medications for this visit.    Allergies as of 08/31/2022 - Review Complete 08/31/2022  Allergen Reaction Noted   Iodine  07/30/2018   Other Swelling 06/14/2022    Family History  Problem Relation Age of Onset   Cancer Mother        Cervical   Diabetes Mother    Hypertension Mother    Heart disease Mother    Hyperlipidemia Brother    Heart disease Brother    Hypertension Maternal Grandmother    Heart disease Maternal Grandmother    Diabetes Maternal Grandmother    Colon cancer Neg Hx    Esophageal cancer Neg Hx    Stomach cancer  Neg Hx    Rectal cancer Neg Hx    Colon polyps Neg Hx    Crohn's disease Neg Hx    Ulcerative colitis Neg Hx     Social History   Socioeconomic History   Marital status: Single    Spouse name: Not on file   Number of children: 2   Years of education: Not on file   Highest education level: Not on file  Occupational History   Occupation: custodian  Tobacco Use   Smoking status: Never   Smokeless tobacco: Never  Vaping Use   Vaping Use: Never used  Substance and Sexual Activity   Alcohol use: Yes    Comment: socially   Drug use: No   Sexual activity: Yes    Birth control/protection: Condom  Other Topics Concern   Not on file  Social History Narrative   Not on file   Social Determinants of Health   Financial Resource Strain: Not on file  Food Insecurity: Not on file  Transportation Needs: Not on file  Physical Activity: Not on file  Stress: Not on file  Social Connections: Not on file  Intimate Partner Violence: Not on file    Review of Systems: Positive for none All other review of systems negative except as mentioned in the HPI.  Physical Exam: Vital signs in last 24 hours: @VSRANGES @   General:   Alert,  Well-developed, well-nourished, pleasant and cooperative in NAD Lungs:  Clear throughout to auscultation.   Heart:  Regular rate and rhythm; no murmurs, clicks, rubs,  or gallops. Abdomen:  Soft, nontender and nondistended. Normal bowel sounds.   Neuro/Psych:  Alert and cooperative. Normal mood and affect. A and O x 3    No significant changes were identified.  The patient continues to be an appropriate candidate for the planned procedure and anesthesia.   Edman Circle, MD. [facility] 08/31/2022 8:20 AM@

## 2022-08-31 NOTE — Progress Notes (Signed)
Pt's states no medical or surgical changes since previsit or office visit. 

## 2022-09-01 ENCOUNTER — Telehealth: Payer: Self-pay

## 2022-09-01 NOTE — Telephone Encounter (Signed)
  Follow up Call-     08/31/2022    8:06 AM 08/13/2020    7:14 AM  Call back number  Post procedure Call Back phone  # [phone]  Permission to leave phone message Yes Yes     Patient questions:  Do you have a fever, pain , or abdominal swelling? No. Pain Score  0 *  Have you tolerated food without any problems? Yes.    Have you been able to return to your normal activities? Yes.    Do you have any questions about your discharge instructions: Diet   No. Medications  No. Follow up visit  No.  Do you have questions or concerns about your Care? No.  Actions: * If pain score is 4 or above: No action needed, pain <4.

## 2022-09-08 ENCOUNTER — Encounter: Payer: Self-pay | Admitting: Gastroenterology

## 2022-09-20 ENCOUNTER — Encounter: Payer: Self-pay | Admitting: Internal Medicine

## 2022-09-20 ENCOUNTER — Ambulatory Visit: Payer: BC Managed Care – PPO | Admitting: Internal Medicine

## 2022-09-20 VITALS — BP 138/70 | HR 71 | Ht 67.0 in | Wt 189.4 lb

## 2022-09-20 DIAGNOSIS — C73 Malignant neoplasm of thyroid gland: Secondary | ICD-10-CM | POA: Diagnosis not present

## 2022-09-20 DIAGNOSIS — E890 Postprocedural hypothyroidism: Secondary | ICD-10-CM

## 2022-09-20 LAB — TSH: TSH: 3.54 u[IU]/mL (ref 0.35–5.50)

## 2022-09-20 NOTE — Progress Notes (Signed)
Patient ID: Terri Klein, female   DOB: 07-Oct-1960, 62 y.o.   MRN: [id number]   HPI  Terri Klein is a 62 y.o.-year-old female, initially referred by Dr. Gerrit Friends, presenting for follow-up for thyroid cancer and postsurgical hypothyroidism.  She moved to the area from [place] in 2015.  Last visit with me 1 year ago.  Interim history: At today's visit she has no complaints other than nausea in the morning.  This is more prominent when she takes levothyroxine in the morning.  She did an experiment to which she skipped levothyroxine and nausea improved.  She does have postnasal drip and acid reflux. She previously had decreased appetite and weight loss.  She lost 8 more pounds net since last visit. She has nausea from Protonix >> dose reduced.  Sees GI. Also,  allergologist for a possible food allergic reaction manifesting with GERD. She had allergy testing >> allergic only to cats (she does not have a cat)  Thyroid cancer: Reviewed and addended her thyroid cancer history: She was diagnosed with thyroid cancer after she found a neck mass in 03/2018 by looking in the mirror.  Retrospectively, she also had some dysphagia.  05/08/2018: Thyroid ultrasound showed several nodules of which 2 were biopsied  05/28/2018: FNA: Right inferior 1.5 cm; Othrer 2 dimensions: 1.4 x 1.1cm, Solid / almost completeley solid, Hypoechoic, TI-RADS total points 4  >> benign Isthmus mid 2.1cm; Other 2 dimensions: 1.7 x 1.2cm, Solid / almost completely solid, Hypoechoic, TI-RADS total points 4 >> ATYPIA OF UNDETERMINED SIGNIFICANCE OR FOLLICULAR LESION OF UNDETERMINED SIGNIFICANCE (BETHESDA CATEGORY III) >> Afirma: suspicious (malignancy risk 50%)  08/09/2018: Total thyroidectomy: Diagnosis Thyroid, thyroidectomy, Total - PAPILLARY THYROID CARCINOMA, FOLLICULAR VARIANT, 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 - CARCINOMA IS CONFINED TO THE THYROID. - FOLLICULAR ADENOMAS. - ONE BENIGN LYMPH NODE (0/1). - SEE ONCOLOGY TABLE BELOW. Microscopic  Comment THYROID GLAND: Procedure: Thyroidectomy. Tumor Focality: Unifocal. Tumor Site: Isthmus. Tumor Size: 2.2 cm, gross measurement. Histologic Type: Papillary thyroid carcinoma, follicular variant. Margins: Negative for carcinoma. Angioinvasion: Not identified. Lymphatic Invasion: Not identified. Extrathyroidal: Not identified. Regional Lymph Nodes: Number of Lymph Nodes Involved: 0 of 1. Pathologic Stage Classification (pTNM, AJCC 8th Edition): pT2, pN0.  She did not have RAI treatment.  Neck U/S (10/07/2019):  Status post total thyroidectomy. No thyroid bed abnormality by Ultrasound. No regional adenopathy.  Review latest thyroglobulin and ATA antibody levels: Lab Results  Component Value Date   THYROGLB 0.1 (L) 09/23/2021   THYROGLB <0.1 (L) 09/15/2020   THYROGLB <0.1 (L) 09/19/2019   THYROGLB 0.3 (L) 09/11/2018   Lab Results  Component Value Date   THGAB <1 09/23/2021   THGAB <1 09/15/2020   THGAB <1 09/19/2019   THGAB <1 09/11/2018   Postsurgical hypothyroidism:  Pt is on levothyroxine 112 mcg daily (last dose change 04/2022), taken: - in am (takes it at 4 am, goes to work at 6 am) - fasting - at least 30 min from b'fast - no Ca, Fe - + PPIs moved 5 hours later - + MVI's ~5 hours after levothyroxine - not on Biotin  Reviewed her TFTs: Lab Results  Component Value Date   TSH 2.02 08/18/2022   TSH 5.27 06/21/2022   TSH 0.28 (L) 09/23/2021   TSH 0.98 09/15/2020   TSH 1.83 09/19/2019   TSH 1.43 01/30/2019   TSH 1.96 12/06/2018   TSH 7.49 (H) 10/25/2018   TSH 12.96 (H) 09/11/2018   FREET4 1.28 08/18/2022   FREET4 1.04 06/21/2022   FREET4  1.44 09/23/2021   FREET4 1.14 09/15/2020   FREET4 1.17 09/19/2019   FREET4 1.13 01/30/2019   FREET4 1.24 12/06/2018   FREET4 0.97 10/25/2018   FREET4 0.74 09/11/2018    She has a history of 2 parathyroid glands resected >30 years ago for hyperparathyroidism causing her kidney stones.  Calcium normalized  afterwards..  Postsurgical hypocalcemia: Of note, her calcium postop was low, at 7.9 but normalized afterwards.  She stopped Tums at the indication of Dr. Gerrit Friends in 08/27/2018.  Latest calcium levels was normal: 05/03/2022: Calcium 8.6 (8.6-10.4) 01/27/2022: Calcium 8.8 (8.6-10.4) Lab Results  Component Value Date   CALCIUM 9.1 07/19/2021   CALCIUM 9.0 06/18/2020   CALCIUM 9.5 05/17/2020   CALCIUM 9.2 09/11/2018   CALCIUM 7.8 (L) 08/10/2018   CALCIUM 9.4 08/02/2018   CALCIUM 9.3 12/23/2015   She denies perioral numbness and cramps in her hands.  Latest vitamin D level was normal: Lab Results  Component Value Date   VD25OH 53.9 09/19/2019   VD25OH 41.70 09/11/2018   She was on ergocalciferol 50,000 units weekly >> off since 2015.  Pt denies: - feeling nodules in neck - hoarseness - dysphagia - choking  No FH of thyroid cancer. No h/o radiation tx to head or neck. No herbal supplements. No Biotin use. No recent steroids use.   She also has a h/o type 2 diabetes.  Latest HbA1c available for review: 01/23/2022: HbA1c 5.7% Lab Results  Component Value Date   HGBA1C 6.3 (H) 08/02/2018  She is on JanuMet.  ROS:  + see HPI  I reviewed pt's medications, allergies, PMH, social hx, family hx, and changes were documented in the history of present illness. Otherwise, unchanged from my initial visit note.  Past Medical History:  Diagnosis Date   Allergy    pet dander   Diabetes mellitus without complication (HCC)    GERD (gastroesophageal reflux disease)    Hyperlipidemia    Hypertension    Kidney stone    during her 20's   Thyroid disease    Past Surgical History:  Procedure Laterality Date   COLONOSCOPY     kidney stones     MYOMECTOMY     THYROIDECTOMY N/A 08/09/2018   Procedure: TOTAL THYROIDECTOMY;  Surgeon: Darnell Level, MD;  Location: [facility];  Service: General;  Laterality: N/A;   UPPER GASTROINTESTINAL ENDOSCOPY  08/13/2020   D. Jacobs,MD [facility]   Social  History   Socioeconomic History   Marital status: Single    Spouse name: Not on file   Number of children: 2   Years of education: Not on file   Highest education level: Not on file  Occupational History   Occupation: custodian  Ecologist strain: Not on file   Food insecurity    Worry: Not on file    Inability: Not on file   Transportation needs    Medical: Not on file    Non-medical: Not on file  Tobacco Use   Smoking status: Never Smoker   Smokeless tobacco: Never Used  Substance and Sexual Activity   Alcohol use: Yes    Comment: socially, beer   Drug use: No   Sexual activity: Yes    Birth control/protection: Condom   Current Outpatient Medications on File Prior to Visit  Medication Sig Dispense Refill   amLODipine-benazepril (LOTREL) 10-40 MG capsule Take 1 capsule by mouth daily. 90 capsule 1   atorvastatin (LIPITOR) 20 MG tablet Take 20 mg by mouth daily.  BLACK ELDERBERRY PO Take 1 tablet by mouth every other day.     EPINEPHrine (EPIPEN 2-PAK) 0.3 mg/0.3 mL IJ SOAJ injection 0.3 mg [place]/IM x1; Info: may repeat dose x1 after 5-99min     levothyroxine (SYNTHROID) 112 MCG tablet Take 1 tablet (112 mcg total) by mouth daily. 90 tablet 1   metoprolol (LOPRESSOR) 50 MG tablet Take 1 tablet (50 mg total) by mouth daily. 90 tablet 1   metoprolol succinate (TOPROL-XL) 50 MG 24 hr tablet Take 50 mg by mouth daily.     multivitamin-iron-minerals-folic acid (CENTRUM) chewable tablet Chew 1 tablet by mouth daily. 90 tablet 1   pantoprazole (PROTONIX) 20 MG tablet Take 20 mg by mouth daily.     sitaGLIPtin-metformin (JANUMET) 50-1000 MG tablet Take 1 tablet by mouth 2 (two) times a day.     No current facility-administered medications on file prior to visit.   Allergies  Allergen Reactions   Iodine     immobilized   Other Swelling    Nuts,cause swelling in face  Had testing and did not test positive   Family History  Problem Relation Age of  Onset   Cancer Mother        Cervical   Diabetes Mother    Hypertension Mother    Heart disease Mother    Hyperlipidemia Brother    Heart disease Brother    Hypertension Maternal Grandmother    Heart disease Maternal Grandmother    Diabetes Maternal Grandmother    Colon cancer Neg Hx    Esophageal cancer Neg Hx    Stomach cancer Neg Hx    Rectal cancer Neg Hx    Colon polyps Neg Hx    Crohn's disease Neg Hx    Ulcerative colitis Neg Hx    PE: BP 138/70   Pulse 71   Ht 5\' 7"  (1.702 m)   Wt 189 lb 6.4 oz (85.9 kg)   LMP 04/20/2008   SpO2 97%   BMI 29.66 kg/m  Wt Readings from Last 3 Encounters:  09/20/22 189 lb 6.4 oz (85.9 kg)  08/31/22 200 lb (90.7 kg)  08/01/22 200 lb (90.7 kg)   Constitutional: overweight, in NAD Eyes: EOMI, no exophthalmos ENT: no neck masses palpable, no cervical lymphadenopathy Cardiovascular: RRR, No MRG Respiratory: CTA B Musculoskeletal: no deformities Skin: no rashes Neurological: no tremor with outstretched hands  ASSESSMENT: 1. Thyroid cancer - see HPI  2. Postsurgical Hypothyroidism  3. Postsurgical hypocalcemia  PLAN:  1. 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  PTC is a slow-growing cancer, with good prognosis.  RAI treatment was not indicated for her -Reviewed latest neck ultrasound results from 09/2019: No recurrences or suspicious masses in her neck. -At last visit, thyroglobulin was 0.1, barely detectable, previously undetectable.  Antithyroglobulin antibodies were not detectable. -We will repeat thyroglobulin and ATA antibodies today.  If thyroglobulin remains detectable and increases, we will need another neck ultrasound. -I we will see her back in 1 year  2. Patient with history of total thyroidectomy for cancer, now with  iatrogenic hypothyroidism - latest thyroid labs reviewed with pt. >> normal: Lab Results  Component Value Date   TSH 2.02 08/18/2022  - she continues on LT4 112 mcg daily, dose decreased 04/2022 - we discussed that she probably needed to have  her LT4 dose decreased in the setting of weight loss.  She lost 15 pounds before the last 2 visits combined.  However, in the last 3 months, she gained 10 pounds. - pt feels good on this dose. - we discussed about taking the thyroid hormone every day, with water, >30 minutes before breakfast, separated by >4 hours from acid reflux medications, calcium, iron, multivitamins. Pt. is taking it correctly now.  He was previously on Protonix, taken to close levothyroxine but we moved this 4 hours after LT4.  However, she described nausea with a tablet but it is unclear whether the nausea is from the water that she drinks with a tablet or the medication itself.  I advised her to try to take it without water and she will try this over the next few days.  If she still gets nauseated when taking the tablet even without water, we will try Tirosint.  I advised her to check with her pharmacist to see if insurance would cover it.  If not, we will need to switch to white levothyroxine tablets, but to amount to her current LT4 dose, we need to alternate 2 tablets with 2.5 tablets every other day, which is a complex regimen.  She agrees with this plan. - will check thyroid tests today: TSH, but this could be slightly higher due to the fact that she was missing levothyroxine tablets on purpose when she was investigating other causes for her nausea - If labs are abnormal, she will need to return for repeat TFTs in 1.5 months  3. Postsurgical hypocalcemia -Previously on ergocalciferol, but she was able to, with normal vitamin D levels -Latest calcium level was normal 01/2022 in 04/2022 -No signs or symptoms of hypocalcemia at today's visit -No further investigation needed  At  today's visit, she tells me that she would also want me to manage her diabetes.  Latest HbA1c available for review is excellent, at 5.7%, so we discussed that she does not necessarily need endocrinology input, but if this is her preference, we can definitely have her back for a separate appointment for this.  Needs refills.  Component     Latest Ref Rng 09/20/2022  TSH     0.35 - 5.50 uIU/mL 3.54   Thyroglobulin Ab     < or = 1 IU/mL <1   Thyroglobulin     ng/mL 0.1 (L)   Comment --   TSH slightly elevated.  I will advise her to increase the levothyroxine dose to 125 mcg daily and have her back for labs in 1.5 months. Globulin is barely detectable while the ATA antibodies are undetectable.  Carlus Pavlov, MD PhD [facility]

## 2022-09-20 NOTE — Patient Instructions (Addendum)
Please continue Levothyroxine 112 mcg daily.  Take the thyroid hormone every day, withOUT water, at least 30 minutes before breakfast, separated by at least 4 hours from: - acid reflux medications - calcium - iron - multivitamins  Please stop at the lab.  Ask the pharmacist whether Tirosint is covered or not.  Please come back for a follow-up appointment in 1 year.

## 2022-09-21 LAB — THYROGLOBULIN LEVEL: Thyroglobulin: 0.1 ng/mL — ABNORMAL LOW

## 2022-09-21 LAB — THYROGLOBULIN ANTIBODY: Thyroglobulin Ab: <1 IU/mL (ref <=1)

## 2022-09-22 MED ORDER — LEVOTHYROXINE SODIUM 125 MCG PO TABS: 125.0000 ug | ORAL_TABLET | Freq: Every day | ORAL | 5 refills | Status: DC

## 2023-02-06 ENCOUNTER — Encounter (HOSPITAL_COMMUNITY): Payer: Self-pay | Admitting: Emergency Medicine

## 2023-02-06 ENCOUNTER — Ambulatory Visit (HOSPITAL_COMMUNITY)
Admission: EM | Admit: 2023-02-06 | Discharge: 2023-02-06 | Disposition: A | Discharge status: Home / Self Care | Payer: BC Managed Care – PPO | Attending: Internal Medicine | Admitting: Internal Medicine

## 2023-02-06 DIAGNOSIS — M545 Low back pain, unspecified: Secondary | ICD-10-CM | POA: Diagnosis not present

## 2023-02-06 MED ORDER — METHOCARBAMOL 500 MG PO TABS: 500.0000 mg | ORAL_TABLET | Freq: Two times a day (BID) | ORAL | 0 refills | Status: DC | PRN

## 2023-02-06 NOTE — ED Triage Notes (Signed)
Pt c/o lower back pain for 3 days. States she has previous back injury but it has just begun to bother her more.

## 2023-02-06 NOTE — ED Provider Notes (Signed)
[facility]    CSN: [id number] Arrival date & time: 02/06/23  0865      History   Chief Complaint Chief Complaint  Patient presents with   Back Pain    HPI Terri Klein is a 62 y.o. female.   Patient presents with lower back pain that started about 3 days ago.  Patient reports that she has a history of a cyst on her spine that she had removed about 2 years ago.  Reports that the pain seemed to return in the same location 3 days ago.  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  She has taken previously prescribed meloxicam a few days prior with minimal improvement in symptoms.  Pain does not radiate down legs.  She is not reporting any urinary frequency, urinary incontinence, saddle anesthesia.   Back Pain   Past Medical History:  Diagnosis Date   Allergy    pet dander   Diabetes mellitus without complication (HCC)    GERD (gastroesophageal reflux disease)    Hyperlipidemia    Hypertension    Kidney stone    during her 20's   Thyroid disease     Patient Active Problem List   Diagnosis Date Noted   Mixed hyperlipidemia 08/06/2020   Murmur, diastolic 08/06/2020   Postsurgical hypothyroidism 09/11/2018   Papillary thyroid carcinoma (HCC) - Follicular variant 08/01/2018   Essential hypertension 12/24/2015   Type 2 diabetes mellitus (HCC) 12/24/2015   Healthcare maintenance 12/24/2015   Hyperlipidemia associated with type 2 diabetes mellitus (HCC) 12/24/2015    Past Surgical History:  Procedure Laterality Date   COLONOSCOPY     kidney stones     MYOMECTOMY     THYROIDECTOMY N/A 08/09/2018   Procedure: TOTAL THYROIDECTOMY;  Surgeon: Darnell Level, MD;  Location: [facility];  Service: General;  Laterality: N/A;   UPPER GASTROINTESTINAL ENDOSCOPY  08/13/2020   D. Jacobs,MD [facility]    OB History   No obstetric history on file.      Home  Medications    Prior to Admission medications   Medication Sig Start Date End Date Taking? Authorizing Provider  methocarbamol (ROBAXIN) 500 MG tablet Take 1 tablet (500 mg total) by mouth 2 (two) times daily as needed for muscle spasms. 02/06/23  Yes Suhaila Troiano, Rolly Salter E, FNP  amLODipine-benazepril (LOTREL) 10-40 MG capsule Take 1 capsule by mouth daily. 12/24/15   Kathlynn Grate, DO  atorvastatin (LIPITOR) 20 MG tablet Take 20 mg by mouth daily.    [provider]  BLACK ELDERBERRY PO Take 1 tablet by mouth every other day.    [provider]  EPINEPHrine (EPIPEN 2-PAK) 0.3 mg/0.3 mL IJ SOAJ injection 0.3 mg [place]/IM x1; Info: may repeat dose x1 after 5-57min 01/22/22   [provider]  levothyroxine (SYNTHROID) 125 MCG tablet Take 1 tablet (125 mcg total) by mouth daily. 09/22/22   Carlus Pavlov, MD  metoprolol (LOPRESSOR) 50 MG tablet Take 1 tablet (50 mg total) by mouth daily. 12/24/15 08/31/22  Kathlynn Grate, DO  metoprolol succinate (TOPROL-XL) 50 MG 24 hr tablet Take 50 mg by mouth daily.    [provider]  multivitamin-iron-minerals-folic acid (CENTRUM) chewable tablet Chew 1 tablet by mouth daily. 12/24/15   Kathlynn Grate, DO  pantoprazole (PROTONIX) 20 MG tablet Take 20 mg by mouth daily. 08/01/22   [provider]  sitaGLIPtin-metformin (JANUMET) 50-1000 MG tablet Take 1 tablet by mouth 2 (two) times a day.    [provider]    Family History Family History  Problem Relation Age of Onset   Cancer Mother        Cervical   Diabetes Mother    Hypertension Mother    Heart disease Mother    Hyperlipidemia Brother    Heart disease Brother    Hypertension Maternal Grandmother    Heart disease Maternal Grandmother    Diabetes Maternal Grandmother    Colon cancer Neg Hx    Esophageal cancer Neg Hx    Stomach cancer Neg Hx    Rectal cancer Neg Hx    Colon polyps Neg Hx    Crohn's disease Neg Hx    Ulcerative colitis Neg  Hx     Social History Social History   Tobacco Use   Smoking status: Never   Smokeless tobacco: Never  Vaping Use   Vaping status: Never Used  Substance Use Topics   Alcohol use: Yes    Comment: socially   Drug use: No     Allergies   Iodine and Other   Review of Systems Review of Systems Per HPI  Physical Exam Triage Vital Signs ED Triage Vitals  Encounter Vitals Group     BP 02/06/23 0900 (!) 143/89     Systolic BP Percentile --      Diastolic BP Percentile --      Pulse Rate 02/06/23 0900 80     Resp 02/06/23 0900 16     Temp 02/06/23 0900 99 F (37.2 C)     Temp Source 02/06/23 0900 Oral     SpO2 02/06/23 0900 94 %     Weight --      Height --      Head Circumference --      Peak Flow --      Pain Score 02/06/23 0903 6     Pain Loc --      Pain Education --      Exclude from Growth Chart --    No data found.  Updated Vital Signs BP (!) 143/89 (BP Location: Right Arm)   Pulse 80   Temp 99 F (37.2 C) (Oral)   Resp 16   LMP 04/20/2008   SpO2 94%   Visual Acuity Right Eye Distance:   Left Eye Distance:   Bilateral Distance:    Right Eye Near:   Left Eye Near:    Bilateral Near:     Physical Exam Constitutional:      General: She is not in acute distress.    Appearance: Normal appearance. She is not toxic-appearing or diaphoretic.  HENT:     Head: Normocephalic and atraumatic.  Eyes:     Extraocular Movements: Extraocular movements intact.     Conjunctiva/sclera: Conjunctivae normal.  Pulmonary:     Effort: Pulmonary effort is normal.  Musculoskeletal:     Comments: Patient has tenderness to palpation to mid lower lumbar region.  No crepitus or step-off noted.  No swelling or discoloration noted.  Patient is able to stand and ambulate.  Neurological:     General: No focal deficit present.     Mental Status: She is alert and oriented to person, place, and time. Mental status is at baseline.     Deep Tendon Reflexes: Reflexes are  normal and symmetric.  Psychiatric:        Mood and Affect:  Mood normal.        Behavior: Behavior normal.        Thought Content: Thought content normal.        Judgment: Judgment normal.      UC Treatments / Results  Labs (all labs ordered are listed, but only abnormal results are displayed) Labs Reviewed - No data to display  EKG   Radiology No results found.  Procedures Procedures (including critical care time)  Medications Ordered in UC Medications - No data to display  Initial Impression / Assessment and Plan / UC Course  I have reviewed the triage vital signs and the nursing notes.  Pertinent labs & imaging results that were available during my care of the patient were reviewed by me and considered in my medical decision making (see chart for details).     Differential diagnoses include cyst that has recurred to spine versus muscular strain/injury.  Offered patient x-ray imaging but she declined this.  Patient states that she needs a work note to rest to see if back pain will improve and I do think this is reasonable given patient's daily job.  Discussed with patient the importance of following up with her spine specialist who performed surgery to ensure that cyst has not recurred.  Prescribed muscle relaxer to see if this will be helpful in case muscular strain is etiology.  Educated patient this can make her drowsy and do not drive or drink alcohol with taking it.  Offered patient IM Toradol today but she declined this.  Advised strict follow-up precautions.  Patient verbalized understanding and was agreeable with plan. Final Clinical Impressions(s) / UC Diagnoses   Final diagnoses:  Acute midline low back pain without sciatica     Discharge Instructions      I have prescribed you muscle relaxer to take as needed.  Please be advised that it can make you drowsy so do not drive or drink alcohol with taking it.  Recommend that you follow-up with your spine  specialist who performed your surgery as well.    ED Prescriptions     Medication Sig Dispense Auth. Provider   methocarbamol (ROBAXIN) 500 MG tablet Take 1 tablet (500 mg total) by mouth 2 (two) times daily as needed for muscle spasms. 20 tablet [place], Acie Fredrickson, [place]      PDMP not reviewed this encounter.   Gustavus Bryant, [place] 02/06/23 [phone]

## 2023-02-06 NOTE — Discharge Instructions (Signed)
I have prescribed you muscle relaxer to take as needed.  Please be advised that it can make you drowsy so do not drive or drink alcohol with taking it.  Recommend that you follow-up with your spine specialist who performed your surgery as well.

## 2023-03-27 LAB — LAB REPORT - SCANNED
A1c: 6
EGFR: 70

## 2023-05-02 ENCOUNTER — Other Ambulatory Visit: Payer: Self-pay | Admitting: Internal Medicine

## 2023-09-21 ENCOUNTER — Encounter: Payer: Self-pay | Admitting: Internal Medicine

## 2023-09-21 ENCOUNTER — Ambulatory Visit: Payer: BC Managed Care – PPO | Admitting: Internal Medicine

## 2023-09-21 VITALS — BP 122/70 | HR 80 | Ht 67.0 in | Wt 175.0 lb

## 2023-09-21 DIAGNOSIS — E890 Postprocedural hypothyroidism: Secondary | ICD-10-CM | POA: Diagnosis not present

## 2023-09-21 DIAGNOSIS — C73 Malignant neoplasm of thyroid gland: Secondary | ICD-10-CM

## 2023-09-21 DIAGNOSIS — E119 Type 2 diabetes mellitus without complications: Secondary | ICD-10-CM | POA: Diagnosis not present

## 2023-09-21 DIAGNOSIS — Z7984 Long term (current) use of oral hypoglycemic drugs: Secondary | ICD-10-CM

## 2023-09-21 MED ORDER — SITAGLIPTIN PHOS-METFORMIN HCL 50-1000 MG PO TABS: 1.0000 | ORAL_TABLET | Freq: Two times a day (BID) | ORAL | 3 refills | Status: DC

## 2023-09-21 NOTE — Patient Instructions (Addendum)
 Please continue Levothyroxine  125 mcg daily.  Take the thyroid  hormone every day, at least 30 minutes before breakfast, separated by at least 4 hours from: - acid reflux medications - calcium  - iron - multivitamins  Please stop at the lab.  Please come back for a follow-up appointment in 6 months.  PATIENT INSTRUCTIONS FOR TYPE 2 DIABETES:  DIET AND EXERCISE Diet and exercise is an important part of diabetic treatment.  We recommended aerobic exercise in the form of brisk walking (working between 40-60% of maximal aerobic capacity, similar to brisk walking) for 150 minutes per week (such as 30 minutes five days per week) along with 3 times per week performing 'resistance' training (using various gauge rubber tubes with handles) 5-10 exercises involving the major muscle groups (upper body, lower body and core) performing 10-15 repetitions (or near fatigue) each exercise. Start at half the above goal but build slowly to reach the above goals. If limited by weight, joint pain, or disability, we recommend daily walking in a swimming pool with water up to waist to reduce pressure from joints while allow for adequate exercise.    BLOOD GLUCOSES Monitoring your blood glucoses is important for continued management of your diabetes. Please check your blood glucoses 2-4 times a day: fasting, before meals and at bedtime (you can rotate these measurements - e.g. one day check before the 3 meals, the next day check before 2 of the meals and before bedtime, etc.).   HYPOGLYCEMIA (low blood sugar) Hypoglycemia is usually a reaction to not eating, exercising, or taking too much insulin / other diabetes drugs.  Symptoms include tremors, sweating, hunger, confusion, headache, etc. Treat IMMEDIATELY with 15 grams of Carbs: 4 glucose tablets  cup regular juice/soda 2 tablespoons raisins 4 teaspoons sugar 1 tablespoon honey Recheck blood glucose in 15 mins and repeat above if still symptomatic/blood glucose  <100.  RECOMMENDATIONS TO REDUCE YOUR RISK OF DIABETIC COMPLICATIONS: * Take your prescribed MEDICATION(S) * Follow a DIABETIC diet: Complex carbs, fiber rich foods, (monounsaturated and polyunsaturated) fats * AVOID saturated/trans fats, high fat foods, >2,300 mg salt per day. * EXERCISE at least 5 times a week for 30 minutes or preferably daily.  * DO NOT SMOKE OR DRINK more than 1 drink a day. * Check your FEET every day. Do not wear tightfitting shoes. Contact us  if you develop an ulcer * See your EYE doctor once a year or more if needed * Get a FLU shot once a year * Get a PNEUMONIA vaccine once before and once after age 59 years  GOALS:  * Your Hemoglobin A1c of <7%  * fasting sugars need to be 80-130 * after meals sugars need to be <180 (2h after you start eating) * Your Systolic BP should be 130 or lower  * Your Diastolic BP should be 80 or lower  * Your HDL (Good Cholesterol) should be 40 or higher  * Your LDL (Bad Cholesterol) should be ideally <70. * Your Triglycerides should be 150 or lower  * Your Urine microalbumin (kidney function) should be <30 * Your Body Mass Index should be 25 or lower   Please consider the following ways to cut down carbs and fat and increase fiber and micronutrients in your diet: - substitute whole grain for white bread or pasta - substitute brown rice for white rice - substitute 90-calorie flat bread pieces for slices of bread when possible - substitute sweet potatoes or yams for white potatoes - substitute humus for margarine -  substitute tofu for cheese when possible - substitute almond or rice milk for regular milk (would not drink soy milk daily due to concern for soy estrogen influence on breast cancer risk) - substitute dark chocolate for other sweets when possible - substitute water - can add lemon or orange slices for taste - for diet sodas (artificial sweeteners will trick your body that you can eat sweets without getting calories and  will lead you to overeating and weight gain in the long run) - do not skip breakfast or other meals (this will slow down the metabolism and will result in more weight gain over time)  - can try smoothies made from fruit and almond/rice milk in am instead of regular breakfast - can also try old-fashioned (not instant) oatmeal made with almond/rice milk in am - order the dressing on the side when eating salad at a restaurant (pour less than half of the dressing on the salad) - eat as little meat as possible - can try juicing, but should not forget that juicing will get rid of the fiber, so would alternate with eating raw veg./fruits or drinking smoothies - use as little oil as possible, even when using olive oil - can dress a salad with a mix of balsamic vinegar and lemon juice, for e.g. - use agave nectar, stevia sugar, or regular sugar rather than artificial sweateners - steam or broil/roast veggies  - snack on veggies/fruit/nuts (unsalted, preferably) when possible, rather than processed foods - reduce or eliminate aspartame in diet (it is in diet sodas, chewing gum, etc) Read the labels!  Try to read Dr. Rosalynn Points book: Program for Reversing Diabetes for other ideas for healthy eating.

## 2023-09-21 NOTE — Progress Notes (Signed)
 Patient ID: Terri Klein, female   DOB: Apr 07, 1960, 63 y.o.   MRN: [id number]   HPI  Terri Klein is a 63 y.o.-year-old female, initially referred by Dr. Eletha, presenting for follow-up for thyroid  cancer and postsurgical hypothyroidism.  At today's visit, she tells me that she is in between primary care physicians and she would want me to also manage her type 2 diabetes, non-insulin -dependent, without complications. She moved to the area from [place]  in 2015.  Last visit with me 1 year ago.  Interim history: At last visit, she had nausea long with postnasal drip and acid reflux.  However, this resolved since last.   She has a little dysequilibrium - when getting up from laying down. No increased urination or blurry vision.  Thyroid  cancer: Reviewed and addended her thyroid  cancer history: She was diagnosed with thyroid  cancer after she found a neck mass in 03/2018 by looking in the mirror.  Retrospectively, she also had some dysphagia.  05/08/2018: Thyroid  ultrasound showed several nodules of which 2 were biopsied  05/28/2018: FNA: Right inferior 1.5 cm; Othrer 2 dimensions: 1.4 x 1.1cm, Solid / almost completeley solid, Hypoechoic, TI-RADS total points 4  >> benign Isthmus mid 2.1cm; Other 2 dimensions: 1.7 x 1.2cm, Solid / almost completely solid, Hypoechoic, TI-RADS total points 4 >> ATYPIA OF UNDETERMINED SIGNIFICANCE OR FOLLICULAR LESION OF UNDETERMINED SIGNIFICANCE (BETHESDA CATEGORY III) >> Afirma: suspicious (malignancy risk 50%)  08/09/2018: Total thyroidectomy: Diagnosis Thyroid , thyroidectomy, Total - PAPILLARY THYROID  CARCINOMA, FOLLICULAR VARIANT, 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 - CARCINOMA IS CONFINED TO THE THYROID . - FOLLICULAR ADENOMAS. - ONE BENIGN LYMPH NODE (0/1). - SEE ONCOLOGY TABLE BELOW. Microscopic Comment THYROID  GLAND: Procedure: Thyroidectomy. Tumor Focality: Unifocal. Tumor Site: Isthmus. Tumor Size: 2.2 cm, gross measurement. Histologic Type: Papillary thyroid   carcinoma, follicular variant. Margins: Negative for carcinoma. Angioinvasion: Not identified. Lymphatic Invasion: Not identified. Extrathyroidal: Not identified. Regional Lymph Nodes: Number of Lymph Nodes Involved: 0 of 1. Pathologic Stage Classification (pTNM, AJCC 8th Edition): pT2, pN0.  She did not have RAI treatment.  Neck U/S (10/07/2019):  Status post total thyroidectomy. No thyroid  bed abnormality by Ultrasound. No regional adenopathy.  Review latest thyroglobulin and ATA antibody levels: Lab Results  Component Value Date   THYROGLB 0.1 (L) 09/20/2022   THYROGLB 0.1 (L) 09/23/2021   THYROGLB <0.1 (L) 09/15/2020   THYROGLB <0.1 (L) 09/19/2019   THYROGLB 0.3 (L) 09/11/2018   Lab Results  Component Value Date   THGAB <1 09/20/2022   THGAB <1 09/23/2021   THGAB <1 09/15/2020   THGAB <1 09/19/2019   THGAB <1 09/11/2018   Postsurgical hypothyroidism:  Pt is on levothyroxine  125 mcg daily (last dose change 09/2023), taken: - in am (takes it at 4 am, goes to work at 6 am) - fasting - at least 30 min from b'fast - no Ca, Fe - + PPIs moved at night - + MVI's moved at night - not on Biotin  Reviewed her TFTs: Lab Results  Component Value Date   TSH 3.54 09/20/2022   TSH 2.02 08/18/2022   TSH 5.27 06/21/2022   TSH 0.28 (L) 09/23/2021   TSH 0.98 09/15/2020   TSH 1.83 09/19/2019   TSH 1.43 01/30/2019   TSH 1.96 12/06/2018   TSH 7.49 (H) 10/25/2018   TSH 12.96 (H) 09/11/2018   FREET4 1.28 08/18/2022   FREET4 1.04 06/21/2022   FREET4 1.44 09/23/2021   FREET4 1.14 09/15/2020   FREET4 1.17 09/19/2019   FREET4 1.13 01/30/2019   FREET4 1.24  12/06/2018   FREET4 0.97 10/25/2018   FREET4 0.74 09/11/2018    She has a history of 2 parathyroid glands resected >30 years ago for hyperparathyroidism causing her kidney stones.  Calcium  normalized afterwards..  Postsurgical hypocalcemia: Of note, her calcium  postop was low, at 7.9 but normalized afterwards.  She  stopped Tums at the indication of Dr. Eletha in 08/27/2018.  Latest calcium  levels was normal: 04/05/2023: Ca 9.6 05/03/2022: Calcium  8.6 (8.6-10.4) 01/27/2022: Calcium  8.8 (8.6-10.4) Lab Results  Component Value Date   CALCIUM  9.1 07/19/2021   CALCIUM  9.0 06/18/2020   CALCIUM  9.5 05/17/2020   CALCIUM  9.2 09/11/2018   CALCIUM  7.8 (L) 08/10/2018   CALCIUM  9.4 08/02/2018   CALCIUM  9.3 12/23/2015   She denies perioral numbness and cramps in her hands.  Latest vitamin D  level was normal: Lab Results  Component Value Date   VD25OH 53.9 09/19/2019   VD25OH 41.70 09/11/2018  She was on ergocalciferol  50,000 units weekly >> off since 2015.  Pt denies: - feeling nodules in neck - hoarseness - dysphagia - choking  No FH of thyroid  cancer. No h/o radiation tx to head or neck. No Biotin use. No recent steroids use.   Type 2 diabetes: - dx. 2009-2010.    Latest HbA1c available for review: 03/27/2023: HbA1c 6.0% 01/23/2022: HbA1c 5.7% Lab Results  Component Value Date   HGBA1C 6.3 (H) 08/02/2018   She is on: - JanuMet  50-1000 mg 2x a day  She checks 1x a month: - am: 93-105  She saw nutrition in the past. Not eating many carbs, no red meat. 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  She is on her feet all day.  She has a h/o kidney stones - GFR was low before, then improved:  Lab Results  Component Value Date   BUN 19 07/19/2021   BUN 24 (H) 06/18/2020   CREATININE 1.21 (H) 07/19/2021   CREATININE 1.14 (H) 06/18/2020  No results found for: MICRALBCREAT  + HL:  On Atorvastatin  20 mg daily.  Last eye exam 11/2022: No DR reportedly, small cataract.  No numbness and tingling in her feet.  + FH of DM: M, MGM - both on insulin . Daughter had GDM.  She sees allergology for a possible food allergic reaction manifesting with GERD. She had allergy testing >> allergic only to cats (she does not have a  cat).  ROS:  + see HPI  I reviewed pt's medications, allergies, PMH, social hx, family hx, and changes were documented in the history of present illness. Otherwise, unchanged from my initial visit note.  Past Medical History:  Diagnosis Date   Allergy    pet dander   Diabetes mellitus without complication (HCC)    GERD (gastroesophageal reflux disease)    Hyperlipidemia    Hypertension    Kidney stone    during her 20's   Thyroid  disease    Past Surgical History:  Procedure Laterality Date   COLONOSCOPY     kidney stones     MYOMECTOMY     THYROIDECTOMY N/A 08/09/2018   Procedure: TOTAL THYROIDECTOMY;  Surgeon: Eletha Boas, MD;  Location: [facility];  Service: General;  Laterality: N/A;   UPPER GASTROINTESTINAL ENDOSCOPY  08/13/2020   D. Jacobs,MD [facility]   Social History   Socioeconomic History   Marital status: Single    Spouse name: Not on file   Number of children: 2  Years of education: Not on file   Highest education level: Not on file  Occupational History   Occupation: custodian  Ecologist strain: Not on file   Food insecurity    Worry: Not on file    Inability: Not on file   Transportation needs    Medical: Not on file    Non-medical: Not on file  Tobacco Use   Smoking status: Never Smoker   Smokeless tobacco: Never Used  Substance and Sexual Activity   Alcohol use: Yes    Comment: socially, beer   Drug use: No   Sexual activity: Yes    Birth control/protection: Condom   Current Outpatient Medications on File Prior to Visit  Medication Sig Dispense Refill   amLODipine -benazepril  (LOTREL) 10-40 MG capsule Take 1 capsule by mouth daily. 90 capsule 1   atorvastatin  (LIPITOR) 20 MG tablet Take 20 mg by mouth daily.     BLACK ELDERBERRY PO Take 1 tablet by mouth every other day.     EPINEPHrine (EPIPEN 2-PAK) 0.3 mg/0.3 mL IJ SOAJ injection 0.3 mg Century/IM x1; Info: may repeat dose x1 after 5-15min     levothyroxine  (SYNTHROID ) 125 MCG  tablet TAKE 1 TABLET BY MOUTH EVERY DAY 90 tablet 2   methocarbamol  (ROBAXIN ) 500 MG tablet Take 1 tablet (500 mg total) by mouth 2 (two) times daily as needed for muscle spasms. 20 tablet 0   metoprolol  (LOPRESSOR ) 50 MG tablet Take 1 tablet (50 mg total) by mouth daily. 90 tablet 1   metoprolol  succinate (TOPROL -XL) 50 MG 24 hr tablet Take 50 mg by mouth daily.     multivitamin-iron-minerals-folic acid (CENTRUM) chewable tablet Chew 1 tablet by mouth daily. 90 tablet 1   pantoprazole  (PROTONIX ) 20 MG tablet Take 20 mg by mouth daily.     sitaGLIPtin -metformin  (JANUMET ) 50-1000 MG tablet Take 1 tablet by mouth 2 (two) times a day.     No current facility-administered medications on file prior to visit.   Allergies  Allergen Reactions   Iodine     immobilized   Other Swelling    Nuts,cause swelling in face  Had testing and did not test positive   Family History  Problem Relation Age of Onset   Cancer Mother        Cervical   Diabetes Mother    Hypertension Mother    Heart disease Mother    Hyperlipidemia Brother    Heart disease Brother    Hypertension Maternal Grandmother    Heart disease Maternal Grandmother    Diabetes Maternal Grandmother    Colon cancer Neg Hx    Esophageal cancer Neg Hx    Stomach cancer Neg Hx    Rectal cancer Neg Hx    Colon polyps Neg Hx    Crohn's disease Neg Hx    Ulcerative colitis Neg Hx    PE: BP 122/70   Pulse 80   Ht 5' 7 (1.702 m)   Wt 175 lb (79.4 kg)   LMP 04/20/2008   SpO2 96%   BMI 27.41 kg/m  Wt Readings from Last 3 Encounters:  09/21/23 175 lb (79.4 kg)  09/20/22 189 lb 6.4 oz (85.9 kg)  08/31/22 200 lb (90.7 kg)   Constitutional: normal weight, in NAD Eyes: EOMI, no exophthalmos ENT: no neck masses palpable, no cervical lymphadenopathy Cardiovascular: RRR, No MRG Respiratory: CTA B Musculoskeletal: no deformities Skin: no rashes Neurological: no tremor with outstretched hands Diabetic Foot Exam - Simple  Simple  Foot Form Diabetic Foot exam was performed with the following findings: Yes 09/21/2023  8:48 AM  Visual Inspection No deformities, no ulcerations, no other skin breakdown bilaterally: Yes Sensation Testing Intact to touch and monofilament testing bilaterally: Yes Pulse Check Posterior Tibialis and Dorsalis pulse intact bilaterally: Yes Comments    ASSESSMENT: 1. Thyroid  cancer - see HPI  2. Postsurgical Hypothyroidism  3. Postsurgical hypocalcemia  4. Type 2 diabetes controlled, non-insulin -dependent, without complications  PLAN:  1. 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  PTC is a slow-growing cancer, with good prognosis.  Treatment was not indicated for her. - Latest ultrasound results were reviewed from 09/2019: No recurrences or suspicious masses in her neck -At last visit, thyroglobulin was barely detectable, at 0.1, stable.  Antithyroglobulin antibodies were not detectable - we we will recheck these today and may need a neck ultrasound if thyroglobulin is higher - OTW, I plan to check this at next visit, 5 years from the previous -I will see her back in a year  2. Patient with history of total thyroidectomy for thyroid  cancer, with subsequent postsurgical hypothyroidism, on levothyroxine  - latest thyroid  labs reviewed with pt. >> normal, but higher than target: Lab Results  Component Value Date   TSH 3.54 09/20/2022  - she continues on LT4 125 mcg daily, dose increased after the above results returned - pt feels good on this dose.  She had gained 10 pounds before last visit, but previously lost 15 - we discussed about taking the thyroid  hormone every day, with water, >30 minutes before breakfast, separated by >4 hours from acid reflux medications, calcium , iron,  multivitamins. Pt. is taking it correctly.  He was previously on Protonix , taken too close to levothyroxine , but  we moved this later in the day.  She also described nausea before last visit and it was unclear whether this was from the water she was drinking with the tablet or the medication itself.  I advised her to take it without water and if she got nauseated, let me know so we could try Tirosint .  I also advised her to check with her pharmacy to see if Tirosint  would be covered.  If not, we discussed that we may need to switch to white tablets, 50 mcg - will check thyroid  tests today: TSH and fT4 - If labs are abnormal, she will need to return for repeat TFTs in 1.5 months  3. Postsurgical hypocalcemia - Previously on ergocalciferol  50,000 units weekly, but she came off afterwards with subsequently normal vitamin D  and calcium  levels - Latest calcium  level was normal, 9.6, in 03/2023.  She takes a multivitamin daily. - No signs or symptoms of hypocalcemia at today's visit - No further investigation needed for this   4. DM2 - At today's visit, she would also want me to manage her type 2 diabetes.  This was diagnosed approximately 15 years ago per her recall.  Latest HbA1c from 03/2023 was 6.0%, slightly higher, but still at goal. - She is only checking sugars approximately once a month, only in the morning.  These are all at goal.  However, we discussed about the importance of checking more frequently, ideally once a day, and to rotate the blood sugar checks throughout the day.  She agrees to start this and bring  the glucometer at next visit - she takes Janumet  twice a day.  I refilled her prescription today. - reviewed CBG targets for blood sugars in patients with diabetes: 70-130 fasting and less than 180 postprandial - discussed about the need of good footcare and given handout - reviewed about the 15-15 hypoglycemia correction roll and given handout - She is doing a good job with the diet  and exercise and I advised  her to continue - we we will recheck her HbA1c at next visit - advised for yearly eye exams >> she is UTD and reportedly has no retinopathy - foot exam performed today - will check an ACR today - return to clinic in 6 months  - time spent for the visit: 45 min, in precharting, post charting, reviewing her previous labs, imaging evaluations, office visit notes, and previous treatments, counseling her about her endocrine conditions (please see the discussed topics above), and developing a plan to investigate and treat them. She had a number of questions which I addressed.    Lela Fendt, MD PhD [facility] Endocrinology

## 2023-09-22 LAB — MICROALBUMIN / CREATININE URINE RATIO
Creatinine, Urine: 173 mg/dL (ref 20–275)
Microalb Creat Ratio: 68 mg/g{creat} — ABNORMAL HIGH (ref <30)
Microalb, Ur: 11.7 mg/dL

## 2023-09-26 ENCOUNTER — Ambulatory Visit: Payer: Self-pay | Admitting: Internal Medicine

## 2023-09-27 LAB — THYROGLOBULIN ANTIBODY: Thyroglobulin Ab: <1 [IU]/mL (ref <=1)

## 2023-09-27 LAB — TSH: TSH: 0.43 m[IU]/L (ref 0.40–4.50)

## 2023-09-27 LAB — THYROGLOBULIN LEVEL: Thyroglobulin: 0.1 ng/mL — ABNORMAL LOW

## 2023-09-27 LAB — T4, FREE: Free T4: 1.8 ng/dL (ref 0.8–1.8)

## 2023-11-05 ENCOUNTER — Ambulatory Visit: Payer: Self-pay | Admitting: Family Medicine

## 2023-11-05 ENCOUNTER — Encounter: Payer: Self-pay | Admitting: Family Medicine

## 2023-11-05 VITALS — BP 124/80 | HR 76 | Ht 67.0 in | Wt 177.8 lb

## 2023-11-05 DIAGNOSIS — E1169 Type 2 diabetes mellitus with other specified complication: Secondary | ICD-10-CM

## 2023-11-05 DIAGNOSIS — E785 Hyperlipidemia, unspecified: Secondary | ICD-10-CM | POA: Diagnosis not present

## 2023-11-05 DIAGNOSIS — E119 Type 2 diabetes mellitus without complications: Secondary | ICD-10-CM

## 2023-11-05 DIAGNOSIS — I10 Essential (primary) hypertension: Secondary | ICD-10-CM

## 2023-11-05 LAB — LIPID PANEL

## 2023-11-05 LAB — POCT GLYCOSYLATED HEMOGLOBIN (HGB A1C): Hemoglobin A1C: 5.7 % — AB (ref 4.0–5.6)

## 2023-11-05 NOTE — Progress Notes (Signed)
 Name: Hazelene Doten   Date of Visit: 11/05/23   Date of last visit with me: Visit date not found   CHIEF COMPLAINT:  Chief Complaint  Patient presents with   Establish Care    New patient.        HPI:  Discussed the use of AI scribe software for clinical note transcription with the patient, who gave verbal consent to proceed.  History of Present Illness   Trinitey Roache is a 63 year old female who presents for a transfer of care and routine follow-up. She is accompanied by her granddaughter, Carlo.  She has a history of diabetes. The exact A1c value from her last visit in June is not available. She mentioned the need for regular podiatry visits due to her diabetes, although it has been a while since her last visit.  She has a history of post-surgical hypocalcemia, though the details of the surgery and current management were not discussed during this visit.  She is on medication for hyperlipidemia, but the specific medication, dose, and frequency were not mentioned. She has not had a recent cholesterol check, and it is unclear when her last lipid panel was performed.  She had a physical exam in January 2025 at her previous primary care provider, [facility]. She is currently insured by [employer] and has not switched to Medicare yet.  She has been receiving regular mammograms, with the last one being almost two years ago, managed by her gynecologist.         OBJECTIVE:       11/05/2023    8:55 AM  Depression screen PHQ 2/9  Decreased Interest 0  Down, Depressed, Hopeless 0  PHQ - 2 Score 0     BP Readings from Last 3 Encounters:  11/05/23 124/80  09/21/23 122/70  02/06/23 (!) 143/89    BP 124/80   Pulse 76   Ht 5' 7 (1.702 m)   Wt 177 lb 12.8 oz (80.6 kg)   LMP 04/20/2008   SpO2 98%   BMI 27.85 kg/m    Physical Exam          Physical Exam Constitutional:      Appearance: Normal appearance.  Neurological:     Mental Status: She is alert and  oriented to person, place, and time.     ASSESSMENT/PLAN:   Assessment & Plan Type 2 diabetes mellitus without complication, without long-term current use of insulin  (HCC)  Hyperlipidemia associated with type 2 diabetes mellitus (HCC)  Essential hypertension    Assessment and Plan    Type 2 diabetes mellitus Well-controlled. No recent complications reported. - Perform A1c test today using point-of-care testing. - Schedule follow-up appointment in six months for diabetes management unless issues arise sooner. - Continue current diabetes medications, will evaluate after A1c however patient does note that she did ask endocrinology to start managing her diabetes.  Will go ahead and do the A1c at this time and determine if any acute changes need to be made but otherwise we will let endocrine handle going forward.  Did advise patient that I have no problem managing this if necessary.  Hyperlipidemia Currently on medication. -Plan to resume statin at current dosage but will evaluate after lipid panel.  Suspect lipid panel will not be elevated given patient's improvement in overall diabetes as well as health. - Order fasting lipid panel today. - Review results and adjust treatment as necessary.         Deklan Minar A. Vita  MD [facility]

## 2023-11-06 ENCOUNTER — Ambulatory Visit: Payer: Self-pay | Admitting: Family Medicine

## 2023-11-06 LAB — COMPREHENSIVE METABOLIC PANEL WITH GFR
ALT: 14 IU/L (ref 0–32)
AST: 14 IU/L (ref 0–40)
Albumin: 4.4 g/dL (ref 3.9–4.9)
Alkaline Phosphatase: 54 IU/L (ref 44–121)
BUN/Creatinine Ratio: 22 (ref 12–28)
BUN: 24 mg/dL (ref 8–27)
Bilirubin Total: 0.3 mg/dL (ref 0.0–1.2)
CO2: 25 mmol/L (ref 20–29)
Calcium: 9.2 mg/dL (ref 8.7–10.3)
Chloride: 98 mmol/L (ref 96–106)
Creatinine, Ser: 1.08 mg/dL — ABNORMAL HIGH (ref 0.57–1.00)
Globulin, Total: 2.5 g/dL (ref 1.5–4.5)
Glucose: 97 mg/dL (ref 70–99)
Potassium: 4.4 mmol/L (ref 3.5–5.2)
Sodium: 138 mmol/L (ref 134–144)
Total Protein: 6.9 g/dL (ref 6.0–8.5)
eGFR: 57 mL/min/1.73 — ABNORMAL LOW (ref >59)

## 2023-11-06 LAB — LIPID PANEL
Cholesterol, Total: 134 mg/dL (ref 100–199)
HDL: 59 mg/dL (ref >39)
LDL CALC COMMENT:: 2.3 ratio (ref 0.0–4.4)
LDL Chol Calc (NIH): 63 mg/dL (ref 0–99)
Triglycerides: 57 mg/dL (ref 0–149)
VLDL Cholesterol Cal: 12 mg/dL (ref 5–40)

## 2024-01-25 ENCOUNTER — Other Ambulatory Visit: Payer: Self-pay | Admitting: Internal Medicine

## 2024-01-30 ENCOUNTER — Telehealth: Payer: Self-pay | Admitting: Family Medicine

## 2024-01-30 ENCOUNTER — Other Ambulatory Visit: Payer: Self-pay

## 2024-01-30 DIAGNOSIS — I10 Essential (primary) hypertension: Secondary | ICD-10-CM

## 2024-01-30 MED ORDER — METOPROLOL SUCCINATE ER 50 MG PO TB24: 50.0000 mg | ORAL_TABLET | Freq: Every day | ORAL | 0 refills | Status: DC

## 2024-01-30 MED ORDER — ATORVASTATIN CALCIUM 20 MG PO TABS: 20.0000 mg | ORAL_TABLET | Freq: Every day | ORAL | 0 refills | Status: DC

## 2024-01-30 MED ORDER — AMLODIPINE BESY-BENAZEPRIL HCL 10-40 MG PO CAPS: 1.0000 | ORAL_CAPSULE | Freq: Every day | ORAL | 1 refills | Status: AC

## 2024-01-30 MED ORDER — SITAGLIPTIN PHOS-METFORMIN HCL 50-1000 MG PO TABS: 1.0000 | ORAL_TABLET | Freq: Two times a day (BID) | ORAL | 3 refills | Status: AC

## 2024-01-30 MED ORDER — PANTOPRAZOLE SODIUM 20 MG PO TBEC: 20.0000 mg | DELAYED_RELEASE_TABLET | Freq: Every day | ORAL | 0 refills | Status: DC

## 2024-01-30 NOTE — Telephone Encounter (Signed)
 Sent in patients medication

## 2024-01-30 NOTE — Telephone Encounter (Signed)
 Pt needs refills on  Amlodipine  Metroprolol  Atorvastatin  Janumet   ( Dr Trixie will take over this in January) Protonix    [facility] [place] [facility]

## 2024-02-04 ENCOUNTER — Other Ambulatory Visit: Payer: Self-pay | Admitting: Family Medicine

## 2024-03-24 ENCOUNTER — Other Ambulatory Visit

## 2024-03-24 ENCOUNTER — Ambulatory Visit: Admitting: Internal Medicine

## 2024-03-24 ENCOUNTER — Encounter: Payer: Self-pay | Admitting: Internal Medicine

## 2024-03-24 VITALS — BP 124/70 | HR 78 | Ht 67.0 in | Wt 181.4 lb

## 2024-03-24 DIAGNOSIS — E119 Type 2 diabetes mellitus without complications: Secondary | ICD-10-CM

## 2024-03-24 DIAGNOSIS — C73 Malignant neoplasm of thyroid gland: Secondary | ICD-10-CM | POA: Diagnosis not present

## 2024-03-24 DIAGNOSIS — E890 Postprocedural hypothyroidism: Secondary | ICD-10-CM | POA: Diagnosis not present

## 2024-03-24 LAB — POCT GLYCOSYLATED HEMOGLOBIN (HGB A1C): Hemoglobin A1C: 5.7 % — AB (ref 4.0–5.6)

## 2024-03-24 NOTE — Patient Instructions (Signed)
 Please continue Levothyroxine  125 mcg daily.  Take the thyroid  hormone every day, at least 30 minutes before breakfast, separated by at least 4 hours from: - acid reflux medications - calcium  - iron - multivitamins  Please stop at the lab.  Please come back for a follow-up appointment in 6 months.

## 2024-03-24 NOTE — Progress Notes (Signed)
 Patient ID: Terri Klein, female   DOB: 1960/03/23, 64 y.o.   MRN: [id number]   HPI  Terri Klein is a 64 y.o.-year-old female, initially referred by Dr. Eletha, presenting for follow-up for thyroid  cancer and postsurgical hypothyroidism. type 2 diabetes, non-insulin -dependent, without complications. She moved to the area from [place]  in 2015.  Last visit with me 6 months ago.  Interim history: At last visit, she had nausea long with postnasal drip and acid reflux.  However, this resolved since last visit.   No increased urination or blurry vision. She stays active, working as a arboriculturist.  She has 2 jobs.  Thyroid  cancer: Reviewed and addended her thyroid  cancer history: She was diagnosed with thyroid  cancer after she found a neck mass in 03/2018 by looking in the mirror.  Retrospectively, she also had some dysphagia.  05/08/2018: Thyroid  ultrasound showed several nodules of which 2 were biopsied  05/28/2018: FNA: Right inferior 1.5 cm; Othrer 2 dimensions: 1.4 x 1.1cm, Solid / almost completeley solid, Hypoechoic, TI-RADS total points 4  >> benign Isthmus mid 2.1cm; Other 2 dimensions: 1.7 x 1.2cm, Solid / almost completely solid, Hypoechoic, TI-RADS total points 4 >> ATYPIA OF UNDETERMINED SIGNIFICANCE OR FOLLICULAR LESION OF UNDETERMINED SIGNIFICANCE (BETHESDA CATEGORY III) >> Afirma: suspicious (malignancy risk 50%)  08/09/2018: Total thyroidectomy: Diagnosis Thyroid , thyroidectomy, Total - PAPILLARY THYROID  CARCINOMA, FOLLICULAR VARIANT, 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 - CARCINOMA IS CONFINED TO THE THYROID . - FOLLICULAR ADENOMAS. - ONE BENIGN LYMPH NODE (0/1). - SEE ONCOLOGY TABLE BELOW. Microscopic Comment THYROID  GLAND: Procedure: Thyroidectomy. Tumor Focality: Unifocal. Tumor Site: Isthmus. Tumor Size: 2.2 cm, gross measurement. Histologic Type: Papillary thyroid  carcinoma, follicular variant. Margins: Negative for carcinoma. Angioinvasion: Not identified. Lymphatic Invasion: Not  identified. Extrathyroidal: Not identified. Regional Lymph Nodes: Number of Lymph Nodes Involved: 0 of 1. Pathologic Stage Classification (pTNM, AJCC 8th Edition): pT2, pN0.  She did not have RAI treatment.  Neck U/S (10/07/2019):  Status post total thyroidectomy. No thyroid  bed abnormality by Ultrasound. No regional adenopathy.  Review latest thyroglobulin and ATA antibody levels: Lab Results  Component Value Date   THYROGLB 0.1 (L) 09/21/2023   THYROGLB 0.1 (L) 09/20/2022   THYROGLB 0.1 (L) 09/23/2021   THYROGLB <0.1 (L) 09/15/2020   THYROGLB <0.1 (L) 09/19/2019   THYROGLB 0.3 (L) 09/11/2018   Lab Results  Component Value Date   THGAB <1 09/21/2023   THGAB <1 09/20/2022   THGAB <1 09/23/2021   THGAB <1 09/15/2020   THGAB <1 09/19/2019   THGAB <1 09/11/2018   Postsurgical hypothyroidism:  Pt is on levothyroxine  125 mcg daily (last dose change 09/2023), taken: - in am (takes it at 4 am, goes to work at 6 am) - fasting - at least 2h from b'fast - no Ca, Fe - + PPIs moved at night - + MVI's moved at night - not on Biotin  Reviewed her TFTs: Lab Results  Component Value Date   TSH 0.43 09/21/2023   TSH 3.54 09/20/2022   TSH 2.02 08/18/2022   TSH 5.27 06/21/2022   TSH 0.28 (L) 09/23/2021   TSH 0.98 09/15/2020   TSH 1.83 09/19/2019   TSH 1.43 01/30/2019   TSH 1.96 12/06/2018   TSH 7.49 (H) 10/25/2018   FREET4 1.8 09/21/2023   FREET4 1.28 08/18/2022   FREET4 1.04 06/21/2022   FREET4 1.44 09/23/2021   FREET4 1.14 09/15/2020   FREET4 1.17 09/19/2019   FREET4 1.13 01/30/2019   FREET4 1.24 12/06/2018   FREET4 0.97 10/25/2018   FREET4  0.74 09/11/2018    She has a history of 2 parathyroid glands resected >30 years ago for hyperparathyroidism causing her kidney stones.  Calcium  normalized afterwards..  Postsurgical hypocalcemia: Of note, her calcium  postop was low, at 7.9 but normalized afterwards.  She stopped Tums at the indication of Dr. Eletha in  08/27/2018.  Latest calcium  levels was normal: Lab Results  Component Value Date   CALCIUM  9.2 11/05/2023   CALCIUM  9.1 07/19/2021   CALCIUM  9.0 06/18/2020   CALCIUM  9.5 05/17/2020   CALCIUM  9.2 09/11/2018   CALCIUM  7.8 (L) 08/10/2018   CALCIUM  9.4 08/02/2018   CALCIUM  9.3 12/23/2015  04/05/2023: Ca 9.6 05/03/2022: Calcium  8.6 (8.6-10.4) 01/27/2022: Calcium  8.8 (8.6-10.4) She denies perioral numbness and cramps in her hands.  Latest vitamin D  level was normal: Lab Results  Component Value Date   VD25OH 53.9 09/19/2019   VD25OH 41.70 09/11/2018  She was on ergocalciferol  50,000 units weekly >> off since 2015.  Pt denies: - feeling nodules in neck - hoarseness - dysphagia - choking  No FH of thyroid  cancer. No h/o radiation tx to head or neck. No Biotin use. No recent steroids use.   Type 2 diabetes: - dx. 2009-2010.    Latest HbA1c available for review: Lab Results  Component Value Date   HGBA1C 5.7 (A) 11/05/2023  03/27/2023: HbA1c 6.0% 01/23/2022: HbA1c 5.7%  She is on: - JanuMet  50-1000 mg 2x a day  She checks every other day: - am: 93-105 >> 104-110 - eve: 90-94  She saw nutrition in the past. Not eating many carbs, no red meat.  She has a h/o kidney stones - GFR was low before, then improved: Lab Results  Component Value Date   BUN 24 11/05/2023   BUN 19 07/19/2021   CREATININE 1.08 (H) 11/05/2023   CREATININE 1.21 (H) 07/19/2021   Lab Results  Component Value Date   MICRALBCREAT 68 (H) 09/21/2023  On Benazepril .  + HL: Lab Results  Component Value Date   CHOL 134 11/05/2023   HDL 59 11/05/2023   LDLCALC 63 11/05/2023   TRIG 57 11/05/2023   CHOLHDL 2.3 11/05/2023   On Atorvastatin  20 mg daily.  Last eye exam 11/2022: No DR reportedly, small cataract.  No numbness and tingling in her feet.  Last foot exam was on 09/21/2023  + FH of DM: M, MGM - both on insulin . Daughter had GDM.  She sees allergology for a possible food allergic reaction  manifesting with GERD. She had allergy testing >> allergic only to cats (she does not have a cat).  ROS:  + see HPI  I reviewed pt's medications, allergies, PMH, social hx, family hx, and changes were documented in the history of present illness. Otherwise, unchanged from my initial visit note.  Past Medical History:  Diagnosis Date   Allergy    pet dander   Diabetes mellitus without complication (HCC)    GERD (gastroesophageal reflux disease)    Hyperlipidemia    Hypertension    Kidney stone    during her 20's   Thyroid  disease    Past Surgical History:  Procedure Laterality Date   COLONOSCOPY     kidney stones     MYOMECTOMY     THYROIDECTOMY N/A 08/09/2018   Procedure: TOTAL THYROIDECTOMY;  Surgeon: Eletha Boas, MD;  Location: [facility];  Service: General;  Laterality: N/A;   UPPER GASTROINTESTINAL ENDOSCOPY  08/13/2020   D. Teressa COME [facility]   Social History   Socioeconomic History  Marital status: Single    Spouse name: Not on file   Number of children: 2   Years of education: Not on file   Highest education level: Not on file  Occupational History   Occupation: custodian  Ecologist strain: Not on file   Food insecurity    Worry: Not on file    Inability: Not on file   Transportation needs    Medical: Not on file    Non-medical: Not on file  Tobacco Use   Smoking status: Never Smoker   Smokeless tobacco: Never Used  Substance and Sexual Activity   Alcohol use: Yes    Comment: socially, beer   Drug use: No   Sexual activity: Yes    Birth control/protection: Condom   Current Outpatient Medications on File Prior to Visit  Medication Sig Dispense Refill   amLODipine -benazepril  (LOTREL) 10-40 MG capsule Take 1 capsule by mouth daily. 90 capsule 1   atorvastatin  (LIPITOR) 20 MG tablet Take 1 tablet (20 mg total) by mouth daily. 90 tablet 0   BLACK ELDERBERRY PO Take 1 tablet by mouth every other day.     EPINEPHrine (EPIPEN 2-PAK) 0.3  mg/0.3 mL IJ SOAJ injection 0.3 mg Clarissa/IM x1; Info: may repeat dose x1 after 5-15min     levothyroxine  (SYNTHROID ) 125 MCG tablet TAKE 1 TABLET BY MOUTH EVERY DAY 90 tablet 2   metoprolol  succinate (TOPROL -XL) 50 MG 24 hr tablet TAKE 1 TABLET BY MOUTH EVERY DAY 90 tablet 0   multivitamin-iron-minerals-folic acid (CENTRUM) chewable tablet Chew 1 tablet by mouth daily. 90 tablet 1   pantoprazole  (PROTONIX ) 20 MG tablet Take 1 tablet (20 mg total) by mouth daily. 90 tablet 0   sitaGLIPtin -metformin  (JANUMET ) 50-1000 MG tablet Take 1 tablet by mouth 2 (two) times daily with a meal. 180 tablet 3   No current facility-administered medications on file prior to visit.   Allergies  Allergen Reactions   Iodine     immobilized   Family History  Problem Relation Age of Onset   Cancer Mother        Cervical   Diabetes Mother    Hypertension Mother    Heart disease Mother    Hyperlipidemia Brother    Heart disease Brother    Hypertension Maternal Grandmother    Heart disease Maternal Grandmother    Diabetes Maternal Grandmother    Colon cancer Neg Hx    Esophageal cancer Neg Hx    Stomach cancer Neg Hx    Rectal cancer Neg Hx    Colon polyps Neg Hx    Crohn's disease Neg Hx    Ulcerative colitis Neg Hx    PE: BP 124/70   Pulse 78   Ht 5' 7 (1.702 m)   Wt 181 lb 6.4 oz (82.3 kg)   LMP 04/20/2008   SpO2 98%   BMI 28.41 kg/m  Wt Readings from Last 3 Encounters:  03/24/24 181 lb 6.4 oz (82.3 kg)  11/05/23 177 lb 12.8 oz (80.6 kg)  09/21/23 175 lb (79.4 kg)   Constitutional: normal weight, in NAD Eyes: EOMI, no exophthalmos ENT: no neck masses palpable, no cervical lymphadenopathy Cardiovascular: RRR, No MRG Respiratory: CTA B Musculoskeletal: no deformities Skin: no rashes Neurological: no tremor with outstretched hands  ASSESSMENT: 1. Thyroid  cancer - see HPI  2. Postsurgical Hypothyroidism  3. Postsurgical hypocalcemia  4. Type 2 diabetes controlled,  non-insulin -dependent, without complications  PLAN:  1. 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 PTC is a slow-growing cancer, with good prognosis.  Further treatment beyond thyroidectomy was not indicated - Latest thyroid  ultrasound was reviewed from 09/2019: No recurrences or suspicious masses in neck - Latest thyroglobulin was stable and barely detectable at 0.1.  Antithyroglobulin antibodies were not detectable - We will recheck these at next visit  - We will repeat the neck ultrasound then, also - I will see her back in 6 months  2. Patient with history of total thyroidectomy for thyroid  cancer, with subsequent postsurgical hypothyroidism, on levothyroxine  - latest thyroid  labs reviewed with pt. >> normal: Lab Results  Component Value Date   TSH 0.43 09/21/2023  - she continues on LT4 125 mcg daily - pt feels good on this dose. - we discussed about taking the thyroid  hormone every day, with water, >30 minutes before breakfast, separated by >4 hours from acid reflux medications, calcium , iron, multivitamins. Pt. is taking it correctly.  She previously described nausea with the levothyroxine  tablet but it was not clear whether she developed this due to the water that she was taking with it or the tablet itself.  We discussed about the possibility of trying Tirosint  or white 50 mcg tablets of levothyroxine , but we did not have to do this.  Her nausea abated. - will check thyroid  tests at next visit  3. Postsurgical hypocalcemia - Previously on ergocalciferol  50,000 units weekly, but she came off afterwards with subsequently normal vitamin D  and calcium  levels - Latest calcium  level was normal in 10/2023:  Lab Results  Component Value Date   CALCIUM  9.2 11/05/2023  - She takes a  multivitamin daily - She denies signs or symptoms of hypocalcemia at today's visit - No further investigation is needed for this  4. DM2 - At last visit she also wanted me to manage her type 2 diabetes.  This was diagnosed approximately 15 years ago per her recall.  - Her latest HbA1c was excellent, at 5.7%, decreased from 6.0%: Lab Results  Component Value Date   HGBA1C 5.7 (A) 11/05/2023  - At last visit she was only checking sugars approximately once a month, in the morning.  These values were at goal.  We discussed about checking more frequently, ideally once a day and rotate blood sugars throughout the day.  We did not change her regimen.  She was doing a good job with diet and exercise and I advised her to continue. - At today's visit, sugars are all at goal, excellent, so we will continue Janumet  twice daily.  She now checks blood sugars every other day, which I advised her to continue. - At last visit, we checked an ACR and this was slightly elevated, at 68.  Will repeat this today.  Orders Placed This Encounter  Procedures   Microalbumin / creatinine urine ratio   POCT glycosylated hemoglobin (Hb A1C)   Lela Fendt, MD PhD [facility] Endocrinology

## 2024-03-25 ENCOUNTER — Ambulatory Visit: Payer: Self-pay | Admitting: Internal Medicine

## 2024-03-25 LAB — MICROALBUMIN / CREATININE URINE RATIO
Creatinine, Urine: 57 mg/dL (ref 20–275)
Microalb Creat Ratio: 42 mg/g{creat} — ABNORMAL HIGH
Microalb, Ur: 2.4 mg/dL

## 2024-04-07 ENCOUNTER — Ambulatory Visit: Admitting: Family Medicine

## 2024-04-10 ENCOUNTER — Encounter: Payer: Self-pay | Admitting: Medical

## 2024-04-10 ENCOUNTER — Ambulatory Visit: Admitting: Medical

## 2024-04-10 VITALS — BP 124/84 | HR 88 | Wt 174.2 lb

## 2024-04-10 DIAGNOSIS — E119 Type 2 diabetes mellitus without complications: Secondary | ICD-10-CM | POA: Diagnosis not present

## 2024-04-10 DIAGNOSIS — E782 Mixed hyperlipidemia: Secondary | ICD-10-CM | POA: Diagnosis not present

## 2024-04-10 DIAGNOSIS — I10 Essential (primary) hypertension: Secondary | ICD-10-CM

## 2024-04-10 DIAGNOSIS — E890 Postprocedural hypothyroidism: Secondary | ICD-10-CM

## 2024-04-10 MED ORDER — PANTOPRAZOLE SODIUM 20 MG PO TBEC: 20.0000 mg | DELAYED_RELEASE_TABLET | Freq: Every day | ORAL | 2 refills | Status: AC

## 2024-04-10 MED ORDER — ATORVASTATIN CALCIUM 20 MG PO TABS: 20.0000 mg | ORAL_TABLET | Freq: Every day | ORAL | 2 refills | Status: AC

## 2024-04-10 MED ORDER — METOPROLOL SUCCINATE ER 50 MG PO TB24: 50.0000 mg | ORAL_TABLET | Freq: Every day | ORAL | 2 refills | Status: AC

## 2024-04-10 NOTE — Progress Notes (Signed)
 "  Name: Terri Klein   Date of Visit: 04/10/24   CHIEF COMPLAINT:  Chief Complaint  Patient presents with   Follow-up    6 month check up on diabetes management.        HPI:  Discussed the use of AI scribe software for clinical note transcription with the patient, who gave verbal consent to proceed.  History of Present Illness   Terri Klein is a 64 year old female with diabetes, hypertension, and chronic kidney disease who presents for a routine six-month follow-up.  Her PCP is out of the office here for a few weeks.  Patient Care Team: Vita Morrow, MD as PCP - General (Family Medicine) Trixie File, MD as Consulting Physician (Internal Medicine) Charlanne Groom, MD as Consulting Physician (Gastroenterology) Rutherford Gain, MD as Consulting Physician (Obstetrics and Gynecology) [facility]  She is currently managing her diabetes with metformin  or Janumet  (50/1000 mg twice daily), and her recent hemoglobin A1c is 5.7%, indicating good control. She also takes levothyroxine  (125 mcg daily) for thyroid  management, atorvastatin  (20 mg daily) for cholesterol, metoprolol  (50 mg once daily), and amlodipine /benazepril  (10/40 mg once daily) for blood pressure, and Protonix  for acid reflux. She adheres to her medication regimen.  Her kidney function tests have shown a GFR below 60 for the past three years. She has a history of kidney stones and underwent a stone procedure in the past, which may have contributed to kidney damage.  She reports a slight weight loss and fluctuating weight over time. 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  Her allergies include iodine. She has received several [facility], including shingles, RSV, COVID, and flu vaccines. She is up to date on her eye exams and cancer screenings, but there is no record in the chart confirming she is up to date on mammograms and Pap smears. She had a colonoscopy a year and a half ago, with the  next one due in 2027.  No other aggravating or relieving factors. No other complaint.  Past Medical History:  Diagnosis Date   Allergy    pet dander   Diabetes mellitus without complication (HCC)    GERD (gastroesophageal reflux disease)    Hyperlipidemia    Hypertension    Kidney stone    during her 20's   Thyroid  disease    Medications Ordered Prior to Encounter[1]  ROS as in subjective    OBJECTIVE:    BP 124/84   Pulse 88   Wt 174 lb 3.2 oz (79 kg)   LMP 04/20/2008   SpO2 98%   BMI 27.28 kg/m    BP Readings from Last 3 Encounters:  04/10/24 124/84  03/24/24 124/70  11/05/23 124/80   Wt Readings from Last 3 Encounters:  04/10/24 174 lb 3.2 oz (79 kg)  03/24/24 181 lb 6.4 oz (82.3 kg)  11/05/23 177 lb 12.8 oz (80.6 kg)    General appearence: alert, no distress, WD/WN,  Neck: supple, no lymphadenopathy, no thyromegaly, no masses Heart: RRR, normal S1, S2, no murmurs Lungs: CTA bilaterally, no wheezes, rhonchi, or rales Extremities: no edema, no cyanosis, no clubbing Pulses: 2+ symmetric, upper and lower extremities, normal cap refill     ASSESSMENT/PLAN:   Encounter Diagnoses  Name Primary?   Type 2 diabetes mellitus without complication, without long-term current use of insulin  (HCC) Yes   Postsurgical hypothyroidism    Mixed hyperlipidemia    Essential hypertension     Chronic  kidney disease stage 3a GFR below 60 for three years, likely due to hypertension and diabetes. Microalbuminuria+. Condition well-managed with current medications. - Maintain blood pressure under 130 mmHg. - Maintain hemoglobin A1c under 7%. - Maintain cholesterol levels under goal. - Ensure adequate hydration, 60-80 ounces of water daily. - Avoid NSAIDs like ibuprofen, Aleve, Motrin. - If severely dehydrated, contact healthcare provider to adjust amlodipine /benazepril  and Janumet . - Consider nephrology referral if kidney function worsens.  Type 2 diabetes  mellitus Well-controlled with hemoglobin A1c at 5.7%. - Continue metformin  (Janumet ) 50/1000 mg twice daily. -reviewed endocrinology notes from 03/2024 visit  Essential hypertension Well-controlled with current medications. Blood pressure stable. - Continue metoprolol  (Toprol  XL) 50 mg once daily. - Continue amlodipine /benazepril  10/40 mg once daily.  Mixed hyperlipidemia Well-managed with atorvastatin . Cholesterol levels at goal. - Continue atorvastatin  (Lipitor) 20 mg daily.  Postsurgical hypothyroidism Well-managed with levothyroxine . Recent thyroid  function tests normal. - Continue levothyroxine  125 mcg daily.  General health maintenance Vaccinations up to date. Recent eye exam completed. Cancer screenings current with gynecologist. - Obtain records of recent eye exam and cancer screenings from gynecologist. - Update [facility]. -last colonoscopy from 2024 reviewed, due repeat 2027  Terri Klein was seen today for follow-up.  Diagnoses and all orders for this visit:  Type 2 diabetes mellitus without complication, without long-term current use of insulin  (HCC)  Postsurgical hypothyroidism  Mixed hyperlipidemia  Essential hypertension    F/u 41mo for well visit   [facility]    [1]  Current Outpatient Medications on File Prior to Visit  Medication Sig Dispense Refill   amLODipine -benazepril  (LOTREL) 10-40 MG capsule Take 1 capsule by mouth daily. 90 capsule 1   atorvastatin  (LIPITOR) 20 MG tablet Take 1 tablet (20 mg total) by mouth daily. 90 tablet 0   BLACK ELDERBERRY PO Take 1 tablet by mouth every other day.     EPINEPHrine (EPIPEN 2-PAK) 0.3 mg/0.3 mL IJ SOAJ injection 0.3 mg Bryant/IM x1; Info: may repeat dose x1 after 5-15min     levothyroxine  (SYNTHROID ) 125 MCG tablet TAKE 1 TABLET BY MOUTH EVERY DAY 90 tablet 2   metoprolol  succinate (TOPROL -XL) 50 MG 24 hr tablet TAKE 1 TABLET BY MOUTH EVERY DAY 90 tablet 0    multivitamin-iron-minerals-folic acid (CENTRUM) chewable tablet Chew 1 tablet by mouth daily. 90 tablet 1   pantoprazole  (PROTONIX ) 20 MG tablet Take 1 tablet (20 mg total) by mouth daily. 90 tablet 0   sitaGLIPtin -metformin  (JANUMET ) 50-1000 MG tablet Take 1 tablet by mouth 2 (two) times daily with a meal. 180 tablet 3   No current facility-administered medications on file prior to visit.   "

## 2024-04-14 ENCOUNTER — Ambulatory Visit: Payer: Self-pay | Admitting: Family Medicine

## 2024-05-12 ENCOUNTER — Ambulatory Visit: Admitting: Family Medicine

## 2024-05-27 ENCOUNTER — Encounter: Admitting: Family Medicine

## 2024-09-29 ENCOUNTER — Ambulatory Visit: Admitting: Internal Medicine
